# Patient Record
Sex: Male | Born: 1945 | Race: Black or African American | Hispanic: No | Marital: Married | Smoking: Never smoker
Health system: Southern US, Community
[De-identification: ages and names within clinical notes are randomized; demographics above are authoritative.]

## PROBLEM LIST (undated history)

## (undated) DIAGNOSIS — I10 Essential (primary) hypertension: Secondary | ICD-10-CM

---

## 2007-09-25 HISTORY — PX: COLONOSCOPY: SHX174

## 2011-06-30 ENCOUNTER — Encounter: Payer: Self-pay | Admitting: *Deleted

## 2011-06-30 ENCOUNTER — Emergency Department (HOSPITAL_BASED_OUTPATIENT_CLINIC_OR_DEPARTMENT_OTHER)
Admission: EM | Admit: 2011-06-30 | Discharge: 2011-06-30 | Disposition: A | Discharge status: Home / Self Care | Payer: Medicare Other | Attending: Emergency Medicine | Admitting: Emergency Medicine

## 2011-06-30 DIAGNOSIS — J209 Acute bronchitis, unspecified: Secondary | ICD-10-CM | POA: Insufficient documentation

## 2011-06-30 DIAGNOSIS — R05 Cough: Secondary | ICD-10-CM | POA: Insufficient documentation

## 2011-06-30 DIAGNOSIS — R079 Chest pain, unspecified: Secondary | ICD-10-CM | POA: Insufficient documentation

## 2011-06-30 DIAGNOSIS — J4489 Other specified chronic obstructive pulmonary disease: Secondary | ICD-10-CM | POA: Insufficient documentation

## 2011-06-30 DIAGNOSIS — R059 Cough, unspecified: Secondary | ICD-10-CM | POA: Insufficient documentation

## 2011-06-30 DIAGNOSIS — J449 Chronic obstructive pulmonary disease, unspecified: Secondary | ICD-10-CM | POA: Insufficient documentation

## 2011-06-30 DIAGNOSIS — J069 Acute upper respiratory infection, unspecified: Secondary | ICD-10-CM

## 2011-06-30 MED ORDER — HYDROCOD POLST-CHLORPHEN POLST 10-8 MG/5ML PO LQCR: 5.0000 mL | Freq: Two times a day (BID) | ORAL | Status: DC | PRN

## 2011-06-30 MED ORDER — AZITHROMYCIN 250 MG PO TABS: 250.0000 mg | ORAL_TABLET | Freq: Every day | ORAL | Status: AC

## 2011-06-30 NOTE — ED Notes (Signed)
Pt reports right side lower rib pain and cough x 1.5 weeks

## 2011-06-30 NOTE — ED Provider Notes (Signed)
History     CSN: [id number] Arrival date & time: 06/30/2011  9:52 PM  Chief Complaint  Patient presents with  . Cough  . Chest Pain    (Consider location/radiation/quality/duration/timing/severity/associated sxs/prior treatment) HPI Comments: The patient is a 65 year old male who presents for evaluation of cough that is nonproductive and has been going on for approximately one month but has been worse over the past 1-1/2 weeks with right-sided chest discomfort with coughing. He reports it is a dry cough, and he denies any fever or chills. He denies shortness of breath and denies posttussive emesis. He denies nasal congestion, headache, sore throat, or ear pain.  Patient is a 65 y.o. male presenting with cough and chest pain. The history is provided by the patient.  Cough This is a new problem. The current episode started more than 1 week ago. The problem occurs every few minutes. The problem has been gradually worsening. The cough is non-productive. There has been no fever. Associated symptoms include chest pain. Pertinent negatives include no chills, no sweats, no weight loss, no ear congestion, no ear pain, no headaches, no rhinorrhea, no sore throat, no myalgias, no shortness of breath, no wheezing and no eye redness. He has tried cough syrup for the symptoms. The treatment provided mild relief. His past medical history is significant for COPD, emphysema and asthma.  Chest Pain The chest pain began 1 - 2 weeks ago. Duration of episode(s) is 5 seconds. Chest pain occurs intermittently. The chest pain is resolved. The pain is associated with coughing. At its most intense, the pain is at 5/10. The pain is currently at 0/10. The severity of the pain is moderate. The quality of the pain is described as aching. The pain does not radiate. Exacerbated by: coughing. Primary symptoms include cough. Pertinent negatives for primary symptoms include no fever, no fatigue, no syncope, no shortness of breath,  no wheezing, no palpitations, no abdominal pain, no nausea, no vomiting and no dizziness.  Pertinent negatives for associated symptoms include no claudication, no diaphoresis, no lower extremity edema, no near-syncope, no numbness, no orthopnea, no paroxysmal nocturnal dyspnea and no weakness.     History reviewed. No pertinent past medical history.  History reviewed. No pertinent past surgical history.  History reviewed. No pertinent family history.  History  Substance Use Topics  . Smoking status: Never Smoker   . Smokeless tobacco: Never Used  . Alcohol Use: Yes     rare      Review of Systems  Constitutional: Negative for fever, chills, weight loss, diaphoresis and fatigue.  HENT: Negative for ear pain, sore throat and rhinorrhea.   Eyes: Negative for redness.  Respiratory: Positive for cough. Negative for shortness of breath and wheezing.   Cardiovascular: Positive for chest pain. Negative for palpitations, orthopnea, claudication, syncope and near-syncope.  Gastrointestinal: Negative for nausea, vomiting and abdominal pain.  Musculoskeletal: Negative for myalgias.  Skin: Negative.   Neurological: Negative for dizziness, weakness, numbness and headaches.  Hematological: Negative for adenopathy.  Psychiatric/Behavioral: Negative.     Allergies  Review of patient's allergies indicates no known allergies.  Home Medications   Current Outpatient Rx  Name Route Sig Dispense Refill  . SYSTANE OP Both Eyes Place 1 drop into both eyes daily as needed. For dry eyes       BP 164/79  Pulse 60  Temp(Src) 99.2 F (37.3 C) (Oral)  Resp 20  Ht 5\' 7"  (1.702 m)  Wt 160 lb (72.576 kg)  BMI 25.06  kg/m2  SpO2 100%  Physical Exam  Nursing note and vitals reviewed. Constitutional: He is oriented to person, place, and time. He appears well-developed and well-nourished. No distress.  HENT:  Head: Normocephalic and atraumatic.  Mouth/Throat: Oropharynx is clear and moist.    Eyes: Conjunctivae and EOM are normal.  Neck: Normal range of motion.  Cardiovascular: Normal rate, regular rhythm, normal heart sounds and intact distal pulses.  Exam reveals no gallop and no friction rub.   No murmur heard. Pulmonary/Chest: Effort normal and breath sounds normal. No respiratory distress. He has no wheezes. He has no rales. He exhibits no tenderness.  Abdominal: Soft. Bowel sounds are normal. He exhibits no distension. There is no tenderness. There is no rebound and no guarding.  Musculoskeletal: Normal range of motion. He exhibits no edema and no tenderness.  Neurological: He is alert and oriented to person, place, and time. He has normal reflexes. No cranial nerve deficit. He exhibits normal muscle tone. Coordination normal.  Skin: Skin is warm and dry. No rash noted. He is not diaphoretic. No erythema. No pallor.  Psychiatric: He has a normal mood and affect.    ED Course  Procedures (including critical care time)  Labs Reviewed - No data to display No results found.   No diagnosis found.    MDM  Viral URI, bronchitis, pneumonia not suspected on physical examination, and this appears to be pleuritic/bronchitic chest pain, not cardiac chest pain.        Felisa Bonier, MD 07/02/11 1034

## 2011-08-14 ENCOUNTER — Encounter (HOSPITAL_COMMUNITY): Payer: Self-pay | Admitting: Emergency Medicine

## 2011-08-14 ENCOUNTER — Emergency Department (HOSPITAL_COMMUNITY)
Admission: EM | Admit: 2011-08-14 | Discharge: 2011-08-15 | Disposition: A | Discharge status: Home / Self Care | Payer: Medicare Other | Attending: Emergency Medicine | Admitting: Emergency Medicine

## 2011-08-14 ENCOUNTER — Emergency Department (HOSPITAL_COMMUNITY): Payer: Medicare Other

## 2011-08-14 DIAGNOSIS — J069 Acute upper respiratory infection, unspecified: Secondary | ICD-10-CM

## 2011-08-14 DIAGNOSIS — R059 Cough, unspecified: Secondary | ICD-10-CM | POA: Insufficient documentation

## 2011-08-14 DIAGNOSIS — Z79899 Other long term (current) drug therapy: Secondary | ICD-10-CM | POA: Insufficient documentation

## 2011-08-14 DIAGNOSIS — R05 Cough: Secondary | ICD-10-CM | POA: Insufficient documentation

## 2011-08-14 DIAGNOSIS — R51 Headache: Secondary | ICD-10-CM | POA: Insufficient documentation

## 2011-08-14 DIAGNOSIS — IMO0001 Reserved for inherently not codable concepts without codable children: Secondary | ICD-10-CM | POA: Insufficient documentation

## 2011-08-14 LAB — COMPREHENSIVE METABOLIC PANEL
Alkaline Phosphatase: 56 U/L (ref 39–117)
BUN: 9 mg/dL (ref 6–23)
CO2: 30 mEq/L (ref 19–32)
Chloride: 103 mEq/L (ref 96–112)
GFR calc Af Amer: >90 mL/min (ref >90)
GFR calc non Af Amer: 86 mL/min — ABNORMAL LOW (ref >90)
Glucose, Bld: 93 mg/dL (ref 70–99)
Potassium: 4.4 mEq/L (ref 3.5–5.1)
Total Bilirubin: 0.7 mg/dL (ref 0.3–1.2)
Total Protein: 7.5 g/dL (ref 6.0–8.3)

## 2011-08-14 LAB — CBC
HCT: 42.2 % (ref 39.0–52.0)
Hemoglobin: 14.4 g/dL (ref 13.0–17.0)
MCHC: 34.1 g/dL (ref 30.0–36.0)
RBC: 4.72 MIL/uL (ref 4.22–5.81)

## 2011-08-14 LAB — URINALYSIS, ROUTINE W REFLEX MICROSCOPIC
Glucose, UA: NEGATIVE mg/dL
Ketones, ur: NEGATIVE mg/dL
Leukocytes, UA: NEGATIVE
pH: 6.5 (ref 5.0–8.0)

## 2011-08-14 LAB — URINE MICROSCOPIC-ADD ON

## 2011-08-14 MED ORDER — BENZONATATE 100 MG PO CAPS: 100.0000 mg | ORAL_CAPSULE | Freq: Three times a day (TID) | ORAL | Status: AC

## 2011-08-14 MED ORDER — CETIRIZINE-PSEUDOEPHEDRINE ER 5-120 MG PO TB12: 1.0000 | ORAL_TABLET | Freq: Every day | ORAL | Status: DC

## 2011-08-14 MED ORDER — OXYCODONE-ACETAMINOPHEN 5-325 MG PO TABS
1.0000 | ORAL_TABLET | Freq: Once | ORAL | Status: AC
  Administered 2011-08-15: 1 via ORAL
  Filled 2011-08-14: qty 1

## 2011-08-14 NOTE — ED Notes (Signed)
PT. REPORTS HEADACHE AND GENERALIZED BODY ACHES Lucas Matthews ONSET THIS EVENING , DENIES FEVER OR CHILLS, SLIGHT LIGHTHEADED.

## 2011-08-16 NOTE — ED Provider Notes (Signed)
History     CSN: [id number] Arrival date & time: 08/14/2011  8:41 PM   First MD Initiated Contact with Patient 08/14/11 2203      Chief Complaint  Patient presents with  . Headache    (Consider location/radiation/quality/duration/timing/severity/associated sxs/prior treatment) Patient is a 65 y.o. male presenting with headaches. The history is provided by the patient.  Headache     Pt presents to the ED with complaints of generalized body aches and cough. Pt denies fever, chills. Pt denies chest pain, abdominal pain, urinary symptoms, severe irretractable headache or focal weakness, difficulty ambulating and respiratory distress.  History reviewed. No pertinent past medical history.  History reviewed. No pertinent past surgical history.  No family history on file.  History  Substance Use Topics  . Smoking status: Never Smoker   . Smokeless tobacco: Never Used  . Alcohol Use: Yes     rare      Review of Systems  Neurological: Positive for headaches.    Allergies  Review of patient's allergies indicates no known allergies.  Home Medications   Current Outpatient Rx  Name Route Sig Dispense Refill  . SYSTANE OP Both Eyes Place 1 drop into both eyes daily as needed. For dry eyes     . BENZONATATE 100 MG PO CAPS Oral Take 1 capsule (100 mg total) by mouth every 8 (eight) hours. 21 capsule 0  . CETIRIZINE-PSEUDOEPHEDRINE 5-120 MG PO TB12 Oral Take 1 tablet by mouth daily. 30 tablet 0    BP 179/86  Pulse 71  Temp 99.3 F (37.4 C)  Resp 20  SpO2 100%  Physical Exam  Constitutional: He is oriented to person, place, and time. He appears well-developed and well-nourished.  HENT:  Head: Normocephalic and atraumatic.  Eyes: EOM are normal. Pupils are equal, round, and reactive to light.  Neck: Normal range of motion.  Cardiovascular: Normal rate and regular rhythm.   Pulmonary/Chest: Effort normal and breath sounds normal.  Musculoskeletal: Normal range of  motion.  Neurological: He is alert and oriented to person, place, and time.  Skin: Skin is warm and dry.    ED Course  Procedures (including critical care time)  Labs Reviewed  URINALYSIS, ROUTINE W REFLEX MICROSCOPIC - Abnormal; Notable for the following:    Hgb urine dipstick TRACE (*)    All other components within normal limits  COMPREHENSIVE METABOLIC PANEL - Abnormal; Notable for the following:    GFR calc non Af Amer 86 (*)    All other components within normal limits  CBC  URINE MICROSCOPIC-ADD ON  LAB REPORT - SCANNED   Dg Chest 2 View  08/14/2011  *RADIOLOGY REPORT*  Clinical Data: Progressively worsening cough over the past 3 months.  CHEST - 2 VIEW 08/14/2011:  Comparison: None.  Findings: Cardiac silhouette normal in size, accentuated on the PA image by the mild pectus excavatum sternal deformity.  Hilar and mediastinal contours unremarkable.  Lungs clear.  Bronchovascular markings normal.  No pleural effusions.  Mild degenerative changes involving the thoracic spine.  IMPRESSION: No acute cardiopulmonary disease.  Original Report Authenticated By: Arnell Sieving, M.D.     1. Upper respiratory infection       MDM  I made my attending aware that pt in fastrack over 30 years old. Pt stable and work-up is negative.        Dorthula Matas, PA 08/16/11 [phone]

## 2011-08-25 NOTE — ED Provider Notes (Signed)
Medical screening examination/treatment/procedure(s) were performed by non-physician practitioner and as supervising physician I was immediately available for consultation/collaboration.    Nelia Shi, MD 08/25/11 2025

## 2012-05-05 ENCOUNTER — Ambulatory Visit: Payer: Federal, State, Local not specified - PPO | Admitting: Family Medicine

## 2012-05-19 ENCOUNTER — Ambulatory Visit: Payer: Federal, State, Local not specified - PPO | Admitting: Family Medicine

## 2012-05-21 ENCOUNTER — Encounter: Payer: Self-pay | Admitting: Family Medicine

## 2012-05-21 ENCOUNTER — Ambulatory Visit (INDEPENDENT_AMBULATORY_CARE_PROVIDER_SITE_OTHER): Payer: Medicare Other | Admitting: Family Medicine

## 2012-05-21 VITALS — BP 169/94 | HR 57 | Ht 66.93 in | Wt 154.0 lb

## 2012-05-21 DIAGNOSIS — Z7689 Persons encountering health services in other specified circumstances: Secondary | ICD-10-CM

## 2012-05-21 DIAGNOSIS — Z7189 Other specified counseling: Secondary | ICD-10-CM

## 2012-05-21 DIAGNOSIS — I10 Essential (primary) hypertension: Secondary | ICD-10-CM

## 2012-05-21 NOTE — Progress Notes (Signed)
  Subjective:    Patient ID: Lucas Matthews, male    DOB: [birth year], 66 y.o.   MRN: [id number]  HPI  New patient here to establish care.  Moved to [place] from [place], [place].  He denies any previous medical problems, although blood pressure is elevated today.  He does not take any medications.  He plans to make a follow up appointment for a complete physical and lab work.  He did have a colonoscopy ~4 years ago that was normal in [place], [place] by a Dr. Yetta Barre.   Review of Systems Denies headache, chest pain, shortness of breath, changes in bowels, difficulty with urination, muscle or joint pain.     Objective:   Physical Exam  Constitutional: He appears well-nourished. No distress.  HENT:  Head: Normocephalic and atraumatic.  Cardiovascular: Normal rate and regular rhythm.   Pulmonary/Chest: Effort normal and breath sounds normal.  Abdominal: Soft. Bowel sounds are normal. He exhibits no distension. There is no tenderness.  Musculoskeletal: He exhibits no edema.          Assessment & Plan:

## 2012-05-21 NOTE — Patient Instructions (Addendum)
Thank you for coming in today, it was very nice to meet you. Your blood pressure is elevated today, if it continues to be elevated at your next visit we should discuss starting a medication to help lower this. Please make physical at your convenience.  We will check lab work and perform a complete physical exam at that time.   You may certainly return sooner for any acute issues. Have a great day!

## 2012-07-16 ENCOUNTER — Encounter: Payer: Federal, State, Local not specified - PPO | Admitting: Family Medicine

## 2014-03-19 ENCOUNTER — Emergency Department (INDEPENDENT_AMBULATORY_CARE_PROVIDER_SITE_OTHER)
Admission: EM | Admit: 2014-03-19 | Discharge: 2014-03-19 | Disposition: A | Discharge status: Home / Self Care | Payer: Medicare Other | Source: Home / Self Care

## 2014-03-19 ENCOUNTER — Encounter (HOSPITAL_COMMUNITY): Payer: Self-pay | Admitting: Emergency Medicine

## 2014-03-19 DIAGNOSIS — X58XXXA Exposure to other specified factors, initial encounter: Secondary | ICD-10-CM

## 2014-03-19 DIAGNOSIS — S46819A Strain of other muscles, fascia and tendons at shoulder and upper arm level, unspecified arm, initial encounter: Secondary | ICD-10-CM

## 2014-03-19 DIAGNOSIS — S43499A Other sprain of unspecified shoulder joint, initial encounter: Secondary | ICD-10-CM

## 2014-03-19 DIAGNOSIS — S46811A Strain of other muscles, fascia and tendons at shoulder and upper arm level, right arm, initial encounter: Secondary | ICD-10-CM

## 2014-03-19 DIAGNOSIS — M609 Myositis, unspecified: Secondary | ICD-10-CM

## 2014-03-19 DIAGNOSIS — IMO0001 Reserved for inherently not codable concepts without codable children: Secondary | ICD-10-CM

## 2014-03-19 HISTORY — DX: Essential (primary) hypertension: I10

## 2014-03-19 MED ORDER — TRAMADOL HCL 50 MG PO TABS: 50.0000 mg | ORAL_TABLET | Freq: Four times a day (QID) | ORAL | Status: DC | PRN

## 2014-03-19 MED ORDER — NAPROXEN 375 MG PO TABS: 375.0000 mg | ORAL_TABLET | Freq: Two times a day (BID) | ORAL | Status: DC

## 2014-03-19 MED ORDER — DICLOFENAC SODIUM 1 % TD GEL
1.0000 | Freq: Four times a day (QID) | TRANSDERMAL | Status: AC
Start: 2014-03-19 — End: ?

## 2014-03-19 NOTE — ED Notes (Signed)
C/o 2 week duration of pain in neck and shoulder. Using OTC w minimal relief, NAD. [facility], but interested in local care for off schedule issues

## 2014-03-19 NOTE — ED Provider Notes (Signed)
CSN: [id number]     Arrival date & time 03/19/14  1226 History   First MD Initiated Contact with Patient 03/19/14 1246     Chief Complaint  Patient presents with  . Neck Pain   (Consider location/radiation/quality/duration/timing/severity/associated sxs/prior Treatment) HPI Comments: C/O pain R side of neck and top of shoulder for 2 weeks, comes and goes. No known trauma. 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 Overhead work increases pain.    Past Medical History  Diagnosis Date  . Hypertension    Past Surgical History  Procedure Laterality Date  . [place]  2009    [place]   History reviewed. No pertinent family history. History  Substance Use Topics  . Smoking status: Never Smoker   . Smokeless tobacco: Never Used  . Alcohol Use: Yes     Comment: rare    Review of Systems  Constitutional: Positive for activity change. Negative for fever and fatigue.  Respiratory: Negative.   Gastrointestinal: Negative.   Genitourinary: Negative.   Musculoskeletal: Positive for myalgias. Negative for back pain, gait problem, joint swelling and neck stiffness.       As per HPI  Skin: Negative.   Neurological: Negative for dizziness, weakness, numbness and headaches.    Allergies  Review of patient's allergies indicates no known allergies.  Home Medications   Prior to Admission medications   Medication Sig Start Date End Date Taking? Authorizing Provider  alfuzosin (UROXATRAL) 10 MG 24 hr tablet Take 10 mg by mouth daily with breakfast.   Yes Historical Provider, MD  hydrochlorothiazide (HYDRODIURIL) 25 MG tablet Take 25 mg by mouth daily.   Yes Historical Provider, MD  diclofenac sodium (VOLTAREN) 1 % GEL Apply 1 application topically 4 (four) times daily. 03/19/14   Hayden Rasmussenavid Mabe, NP  naproxen (NAPROSYN) 375 MG tablet Take 1 tablet (375 mg total) by mouth 2 (two) times daily. Prn muscle pain. Take with food. 03/19/14   Hayden Rasmussenavid Mabe, NP  traMADol (ULTRAM) 50 MG tablet Take 1 tablet (50 mg  total) by mouth every 6 (six) hours as needed. 03/19/14   Hayden Rasmussenavid Mabe, NP   BP 136/55  Pulse 64  Temp(Src) 98.2 F (36.8 C) (Oral)  Resp 20  SpO2 98% Physical Exam  Nursing note and vitals reviewed. Constitutional: He is oriented to person, place, and time. He appears well-developed and well-nourished.  HENT:  Head: Normocephalic and atraumatic.  Eyes: EOM are normal.  Neck: Normal range of motion. Neck supple.  Pulmonary/Chest: Effort normal. No respiratory distress.  Musculoskeletal:  Mild tenderness in R trapezius including the R lateral neck points of insertion and the ridge.  Mild tenderness R deltoid muscle. No swelling, deformity or discoloration. No shoulder asymmetry. Full ROM of shoulder joint. Pain at 160 deg to the trapezius ridge.Distal N/V, M/S intact. Rad pulse 2+  Neurological: He is alert and oriented to person, place, and time. No cranial nerve deficit.  Skin: Skin is warm and dry.  Psychiatric: He has a normal mood and affect.    ED Course  Procedures (including critical care time) Labs Review Labs Reviewed - No data to display  Imaging Review No results found.   MDM   1. Trapezius strain, right, initial encounter   2. Myofasciitis     Modify activties that exacerbated pain as discussed Naprosyn bid prn Diclofenac gel as dir Tramadol 50 mg #20 Heat several times a day    Hayden Rasmussenavid Mabe, NP 03/19/14 1332

## 2014-03-19 NOTE — Discharge Instructions (Signed)
Muscle Strain A muscle strain is an injury that occurs when a muscle is stretched beyond its normal length. Usually a small number of muscle fibers are torn when this happens. Muscle strain is rated in degrees. First-degree strains have the least amount of muscle fiber tearing and pain. Second-degree and third-degree strains have increasingly more tearing and pain.  Usually, recovery from muscle strain takes 1-2 weeks. Complete healing takes 5-6 weeks.  CAUSES  Muscle strain happens when a sudden, violent force placed on a muscle stretches it too far. This may occur with lifting, sports, or a fall.  RISK FACTORS Muscle strain is especially common in athletes.  SIGNS AND SYMPTOMS At the site of the muscle strain, there may be:  Pain.  Bruising.  Swelling.  Difficulty using the muscle due to pain or lack of normal function. DIAGNOSIS  Your health care provider will perform a physical exam and ask about your medical history. TREATMENT  Often, the best treatment for a muscle strain is resting, icing, and applying cold compresses to the injured area.  HOME CARE INSTRUCTIONS   Use the PRICE method of treatment to promote muscle healing during the first 2-3 days after your injury. The PRICE method involves:  Protecting the muscle from being injured again.  Restricting your activity and resting the injured body part.  Icing your injury. To do this, put ice in a plastic bag. Place a towel between your skin and the bag. Then, apply the ice and leave it on from 15-20 minutes each hour. After the third day, switch to moist heat packs.  Apply compression to the injured area with a splint or elastic bandage. Be careful not to wrap it too tightly. This may interfere with blood circulation or increase swelling.  Elevate the injured body part above the level of your heart as often as you can.  Only take over-the-counter or prescription medicines for pain, discomfort, or fever as directed by your  health care provider.  Warming up prior to exercise helps to prevent future muscle strains. SEEK MEDICAL CARE IF:   You have increasing pain or swelling in the injured area.  You have numbness, tingling, or a significant loss of strength in the injured area. MAKE SURE YOU:   Understand these instructions.  Will watch your condition.  Will get help right away if you are not doing well or get worse. Document Released: 09/10/2005 Document Revised: 07/01/2013 Document Reviewed: 04/09/2013 [facility] Patient Information 2015 [employer] [place]. This information is not intended to replace advice given to you by your health care provider. Make sure you discuss any questions you have with your health care provider.  Myofascial Pain Syndrome  Myofascial pain (MP) is one of the most common causes of discomfort. This pain may be felt in the muscles. It may come and go. MP always has trigger or tender points in the muscle that will cause pain when pressed. HOME CARE   Learn to do gentle exercise. Stretch.  Change your position every hour.  Eat a healthy diet.  Find ways to relax and lessen stress.  Avoid things that make your pain worse.  Get a massage by someone trained to release trigger or tender points.  Only take medicine as told by your doctor.  Get follow-up care as needed. GET HELP RIGHT AWAY IF:   Your pain suddenly gets worse.  You are not able to move your arms or legs.  You have pain in your chest.  It is hard to  breathe. MAKE SURE YOU:  Understand these instructions.  Will watch your condition.  Will get help right away if you are not doing well or get worse. Document Released: 08/23/2008 Document Revised: 12/03/2011 Document Reviewed: 08/23/2008 [facility] Patient Information 2015 [place] [place]. This information is not intended to replace advice given to you by your health care provider. Make sure you discuss any questions you have with your health care  provider.

## 2014-03-23 NOTE — ED Provider Notes (Signed)
Medical screening examination/treatment/procedure(s) were performed by a resident physician or non-physician practitioner and as the supervising physician I was immediately available for consultation/collaboration.  Evan Corey, MD    Evan S Corey, MD 03/23/14 0732 

## 2017-06-26 ENCOUNTER — Emergency Department (HOSPITAL_BASED_OUTPATIENT_CLINIC_OR_DEPARTMENT_OTHER)
Admission: EM | Admit: 2017-06-26 | Discharge: 2017-06-26 | Disposition: A | Discharge status: Home / Self Care | Payer: Medicare Other | Attending: Emergency Medicine | Admitting: Emergency Medicine

## 2017-06-26 ENCOUNTER — Encounter (HOSPITAL_BASED_OUTPATIENT_CLINIC_OR_DEPARTMENT_OTHER): Payer: Self-pay

## 2017-06-26 DIAGNOSIS — I10 Essential (primary) hypertension: Secondary | ICD-10-CM | POA: Diagnosis not present

## 2017-06-26 DIAGNOSIS — R05 Cough: Secondary | ICD-10-CM

## 2017-06-26 DIAGNOSIS — R51 Headache: Secondary | ICD-10-CM | POA: Diagnosis not present

## 2017-06-26 DIAGNOSIS — Z79899 Other long term (current) drug therapy: Secondary | ICD-10-CM | POA: Diagnosis not present

## 2017-06-26 DIAGNOSIS — R0981 Nasal congestion: Secondary | ICD-10-CM | POA: Diagnosis present

## 2017-06-26 DIAGNOSIS — R059 Cough, unspecified: Secondary | ICD-10-CM

## 2017-06-26 MED ORDER — CETIRIZINE-PSEUDOEPHEDRINE ER 5-120 MG PO TB12: 1.0000 | ORAL_TABLET | Freq: Two times a day (BID) | ORAL | Status: DC | PRN

## 2017-06-26 MED ORDER — OXYMETAZOLINE HCL 0.05 % NA SOLN
1.0000 | Freq: Two times a day (BID) | NASAL | Status: DC | PRN
  Administered 2017-06-26: 1 via NASAL
  Filled 2017-06-26: qty 15

## 2017-06-26 NOTE — ED Provider Notes (Signed)
[facility] Provider Note: Lowella Dell, MD, FACEP  CSN: [id number] MRN: [id number] ARRIVAL: 06/26/17 at 0444 ROOM: MH02/MH02   CHIEF COMPLAINT  URI   HISTORY OF PRESENT ILLNESS  06/26/17 4:55 AM Lucas Matthews is a 71 y.o. male who is currently being worked up for possible asbestos exposure in the past. He is on a Breo Ellipta inhaler for his chronic lung symptoms. He is here with a three-day history of nasal congestion, headache, scratchy throat and "nagging" dry cough. He denies fever or significant shortness of breath. He did have diarrhea yesterday. He has been taking over-the-counter Delsym for the cough but no decongestants or analgesics. He rates his headache as a 5 out of 10.   Past Medical History:  Diagnosis Date  . Hypertension     Past Surgical History:  Procedure Laterality Date  . COLONOSCOPY  2009   Done in [place], [place]    No family history on file.  Social History  Substance Use Topics  . Smoking status: Never Smoker  . Smokeless tobacco: Never Used  . Alcohol use Yes     Comment: rare    Prior to Admission medications   Medication Sig Start Date End Date Taking? Authorizing Provider  alfuzosin (UROXATRAL) 10 MG 24 hr tablet Take 10 mg by mouth daily with breakfast.    [provider]  diclofenac sodium (VOLTAREN) 1 % GEL Apply 1 application topically 4 (four) times daily. 03/19/14   Hayden Rasmussen, NP  hydrochlorothiazide (HYDRODIURIL) 25 MG tablet Take 25 mg by mouth daily.    [provider]  naproxen (NAPROSYN) 375 MG tablet Take 1 tablet (375 mg total) by mouth 2 (two) times daily. Prn muscle pain. Take with food. 03/19/14   Hayden Rasmussen, NP  traMADol (ULTRAM) 50 MG tablet Take 1 tablet (50 mg total) by mouth every 6 (six) hours as needed. 03/19/14   Hayden Rasmussen, NP    Allergies Patient has no known allergies.   REVIEW OF SYSTEMS  Negative except as noted here or in the History of Present Illness.   PHYSICAL  EXAMINATION  Initial Vital Signs Blood pressure (!) 146/75, pulse 84, temperature 98.4 F (36.9 C), temperature source Oral, resp. rate 18, height  (1.702 m), weight 71.7 kg (158 lb), SpO2 98 %.  Examination General: Well-developed, well-nourished male in no acute distress; appearance consistent with age of record HENT: normocephalic; atraumatic; no pharyngeal erythema or exudate; mild nasal congestion; sinuses not tender to percussion Eyes: pupils equal, round and reactive to light; extraocular muscles intact Neck: supple Heart: regular rate and rhythm Lungs: Decreased air movement bilaterally without wheezing Abdomen: soft; nondistended; nontender; bowel sounds present Extremities: No deformity; full range of motion Neurologic: Awake, alert and oriented; motor function intact in all extremities and symmetric; no facial droop Skin: Warm and dry Psychiatric: Normal mood and affect   RESULTS  Summary of this visit's results, reviewed by myself:   EKG Interpretation  Date/Time:    Ventricular Rate:    PR Interval:    QRS Duration:   QT Interval:    QTC Calculation:   R Axis:     Text Interpretation:        Laboratory Studies: No results found for this or any previous visit (from the past 24 hour(s)). Imaging Studies: No results found.  ED COURSE  Nursing notes and initial vitals signs, including pulse oximetry, reviewed.  Vitals:   06/26/17 0451  BP: (!) 146/75  Pulse: 84  Resp: 18  Temp: 98.4 F (36.9 C)  TempSrc: Oral  SpO2: 98%  Weight: 71.7 kg (158 lb)  Height:  (1.702 m)   Will treat with Afrin for three days and start Zyrtec-D as his symptoms are suggestive of allergic etiology.  PROCEDURES    ED DIAGNOSES     ICD-10-CM   1. Nasal congestion R09.81   2. Cough R05        Aviance [place], Jonny Ruiz, MD 06/26/17 [phone]

## 2017-06-26 NOTE — ED Triage Notes (Signed)
Pt c/o runny nose, headache and scratchy throat for three days, no fevers, pt tried delsym a few times without relief

## 2017-06-26 NOTE — ED Notes (Signed)
Pt verbalizes understanding of d/c instructions and denies any further needs at this time. 

## 2018-07-24 ENCOUNTER — Emergency Department: Payer: Medicare Other

## 2018-07-24 ENCOUNTER — Other Ambulatory Visit: Payer: Self-pay

## 2018-07-24 DIAGNOSIS — J069 Acute upper respiratory infection, unspecified: Secondary | ICD-10-CM | POA: Insufficient documentation

## 2018-07-24 DIAGNOSIS — Z79899 Other long term (current) drug therapy: Secondary | ICD-10-CM | POA: Insufficient documentation

## 2018-07-24 DIAGNOSIS — I10 Essential (primary) hypertension: Secondary | ICD-10-CM | POA: Insufficient documentation

## 2018-07-24 DIAGNOSIS — R0981 Nasal congestion: Secondary | ICD-10-CM | POA: Diagnosis present

## 2018-07-24 LAB — BASIC METABOLIC PANEL
Anion gap: 6 (ref 5–15)
BUN: 21 mg/dL (ref 8–23)
CALCIUM: 8.5 mg/dL — AB (ref 8.9–10.3)
CO2: 29 mmol/L (ref 22–32)
CREATININE: 1.14 mg/dL (ref 0.61–1.24)
Chloride: 105 mmol/L (ref 98–111)
GFR calc non Af Amer: >60 mL/min (ref >60)
Glucose, Bld: 85 mg/dL (ref 70–99)
Potassium: 3.8 mmol/L (ref 3.5–5.1)
Sodium: 140 mmol/L (ref 135–145)

## 2018-07-24 LAB — CBC
HCT: 43 % (ref 39.0–52.0)
Hemoglobin: 14.2 g/dL (ref 13.0–17.0)
MCH: 30 pg (ref 26.0–34.0)
MCHC: 33 g/dL (ref 30.0–36.0)
MCV: 90.7 fL (ref 80.0–100.0)
NRBC: 0 % (ref 0.0–0.2)
PLATELETS: 172 10*3/uL (ref 150–400)
RBC: 4.74 MIL/uL (ref 4.22–5.81)
RDW: 13.2 % (ref 11.5–15.5)
WBC: 5.5 10*3/uL (ref 4.0–10.5)

## 2018-07-24 LAB — TROPONIN I: Troponin I: <0.03 ng/mL (ref <0.03)

## 2018-07-24 NOTE — ED Triage Notes (Signed)
Pt c/o headache. HA yesterday.    Dx with URI a few days ago. Also c/o of SOB. States "I'm breathing kind of short like." no resp distress noted. Denies fever.   A&O, ambulatory. No distress noted.

## 2018-07-25 ENCOUNTER — Emergency Department: Payer: Medicare Other

## 2018-07-25 ENCOUNTER — Emergency Department
Admission: EM | Admit: 2018-07-25 | Discharge: 2018-07-25 | Disposition: A | Discharge status: Home / Self Care | Payer: Medicare Other | Attending: Emergency Medicine | Admitting: Emergency Medicine

## 2018-07-25 DIAGNOSIS — J069 Acute upper respiratory infection, unspecified: Secondary | ICD-10-CM

## 2018-07-25 MED ORDER — AZITHROMYCIN 500 MG PO TABS: 500.0000 mg | ORAL_TABLET | Freq: Every day | ORAL | 0 refills | Status: AC

## 2018-07-25 MED ORDER — AZITHROMYCIN 500 MG PO TABS: 500.0000 mg | ORAL_TABLET | Freq: Every day | ORAL | 0 refills | Status: DC

## 2018-07-25 NOTE — ED Provider Notes (Signed)
[facility] Emergency Department Provider Note    First MD Initiated Contact with Patient 07/25/18 [id number]     (approximate)  I have reviewed the triage vital signs and the nursing notes.   HISTORY  Chief Complaint Headache and Shortness of Breath    HPI Lucas Matthews is a 72 y.o. 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  Patient denies any fever.  Patient states that he was seen by urgent care and diagnosed with URI type Tessalon Perles and nasal spray.  Patient states that he was supposed to return to work tonight however states that he does not feel "up to it secondary to symptoms.  Patient admitted to headache on arrival to the emergency department however he states that headache is since resolved.  Patient denies any weakness numbness gait instability or visual changes.  Past Medical History:  Diagnosis Date  . Hypertension     There are no active problems to display for this patient.   Past Surgical History:  Procedure Laterality Date  . COLONOSCOPY  2009   Done in [place], [place]    Prior to Admission medications   Medication Sig Start Date End Date Taking? Authorizing Provider  alfuzosin (UROXATRAL) 10 MG 24 hr tablet Take 10 mg by mouth daily with breakfast.    [provider]  cetirizine-pseudoephedrine (ZYRTEC-D) 5-120 MG tablet Take 1 tablet by mouth 2 (two) times daily as needed for allergies or rhinitis. 06/26/17   Molpus, John, MD  diclofenac sodium (VOLTAREN) 1 % GEL Apply 1 application topically 4 (four) times daily. 03/19/14   Hayden Rasmussen, NP  hydrochlorothiazide (HYDRODIURIL) 25 MG tablet Take 25 mg by mouth daily.    [provider]  naproxen (NAPROSYN) 375 MG tablet Take 1 tablet (375 mg total) by mouth 2 (two) times daily. Prn muscle pain.  Take with food. 03/19/14   Hayden Rasmussen, NP  traMADol (ULTRAM) 50 MG tablet Take 1 tablet (50 mg total) by mouth every 6 (six) hours as needed. 03/19/14   Hayden Rasmussen, NP    Allergies No known drug allergies History reviewed. No pertinent family history.  Social History Social History   Tobacco Use  . Smoking status: Never Smoker  . Smokeless tobacco: Never Used  Substance Use Topics  . Alcohol use: Yes    Comment: rare  . Drug use: No    Review of Systems Constitutional: No fever/chills Eyes: No visual changes. ENT: No sore throat.  Positive for nasal congestion Cardiovascular: Denies chest pain. Respiratory: Positive for dyspnea.   Gastrointestinal: No abdominal pain.  No nausea, no vomiting.  No diarrhea.  No constipation. Genitourinary: Negative for dysuria. Musculoskeletal: Negative for neck pain.  Negative for back pain. Integumentary: Negative for rash. Neurological: Negative for headaches, focal weakness or numbness.  ____________________________________________   PHYSICAL EXAM:  VITAL SIGNS: ED Triage Vitals  Enc Vitals Group     BP 07/24/18 2143 133/73     Pulse Rate 07/24/18 2143 (!) 59     Resp 07/24/18 2143 18     Temp 07/24/18 2143 98.4 F (36.9 C)     Temp Source 07/24/18 2143 Oral     SpO2 07/24/18 2143 99 %     Weight 07/24/18 2144 71.2 kg (157 lb)     Height 07/24/18 2144 1.702 m (5\' 7" )     Head  Circumference --      Peak Flow --      Pain Score 07/24/18 2144 6     Pain Loc --      Pain Edu? --      Excl. in GC? --     Constitutional: Alert and oriented. Well appearing and in no acute distress. Eyes: Conjunctivae are normal. PERRL. EOMI. Ears:  Healthy appearing ear canals and clear fluid noted posterior bilateral TM Nose: No congestion/rhinnorhea. Mouth/Throat: Mucous membranes are moist. Oropharynx non-erythematous. Neck: No stridor.   Cardiovascular: Normal rate, regular rhythm. Good peripheral circulation. Grossly normal heart  sounds. Respiratory: Normal respiratory effort.  No retractions. Lungs CTAB. Gastrointestinal: Soft and nontender. No distention.  Musculoskeletal: No lower extremity tenderness nor edema. No gross deformities of extremities. Neurologic:  Normal speech and language. No gross focal neurologic deficits are appreciated.  Skin:  Skin is warm, dry and intact. No rash noted. Psychiatric: Mood and affect are normal. Speech and behavior are normal.  ____________________________________________   LABS (all labs ordered are listed, but only abnormal results are displayed)  Labs Reviewed  BASIC METABOLIC PANEL - Abnormal; Notable for the following components:      Result Value   Calcium 8.5 (*)    All other components within normal limits  CBC  TROPONIN I   ____________________________________________  EKG  ED ECG REPORT I, [place] Freya Zobrist, the attending physician, personally viewed and interpreted this ECG.   Date: 07/25/2018  EKG Time: 9:42 PM  Rate: 57  Rhythm: Normal sinus rhythm  Axis: Normal  Intervals: Normal  ST&T Change: None  ____________________________________________  RADIOLOGY I, [place] Kester Stimpson, personally viewed and evaluated these images (plain radiographs) as part of my medical decision making, as well as reviewing the written report by the radiologist.  ED MD interpretation: Hazy masslike density over the left lateral mid upper lung possibly secondary to a pleural-based lesion.  Additional small nodular lesion noted in the right lung on chest x-ray per radiologist.  Official radiology report(s): Dg Chest 2 View  Result Date: 07/24/2018 CLINICAL DATA:  Shortness of breath and headache since yesterday. EXAM: CHEST - 2 VIEW COMPARISON:  None. FINDINGS: Lungs are adequately inflated without effusion or pneumothorax. Hazy masslike opacity over the lateral left mid to upper lung which may be pleural based. Much smaller possible pleural based density of the lower  lateral left lung. 1 cm nodular density over the right mid to lower lung. Possible minimal diaphragmatic calcification on the lateral film. Cardiomediastinal silhouette is within normal. There are mild degenerate changes of the spine. IMPRESSION: Hazy masslike density over the lateral left mid to upper lung which may be pleural based. Possible second pleural-based area of nodularity over the lower lateral left thorax. Small nodular density over the right midlung. Possible pleural calcification over the diaphragm. Recommend noncontrast chest CT for further evaluation. Electronically Signed   By: Elberta Fortis M.D.   On: 07/24/2018 22:17      Procedures   ____________________________________________   INITIAL IMPRESSION / ASSESSMENT AND PLAN / ED COURSE  As part of my medical decision making, I reviewed the following data within the electronic MEDICAL RECORD NUMBER   72 year old male presenting with above-stated history and physical exam secondary to nasal congestion nonproductive cough and dyspnea.  Chest x-ray revealed pleural-based possible lesion and additional pulmonary nodular lesion in the right.  Reviewed the patient's chart from [facility] which revealed that patient has been seen by pulmonary on multiple occasions secondary to  asbestos exposure and known pulmonary nodule.  CT scan from 12/19/2017 revealed stated nodule on x-ray as well as pleural-based lesion.  Suspect patient's symptoms today to be continuation of the patient's known URI.  Given 1-1/2-week history of same will prescribe azithromycin at this time. ____________________________________________  FINAL CLINICAL IMPRESSION(S) / ED DIAGNOSES  Final diagnoses:  Upper respiratory tract infection, unspecified type     MEDICATIONS GIVEN DURING THIS VISIT:  Medications - No data to display   ED Discharge Orders    None       Note:  This document was prepared using Dragon voice recognition software and may include unintentional  dictation errors.    Darci Current, MD 07/25/18 0111

## 2018-09-23 ENCOUNTER — Other Ambulatory Visit: Payer: Self-pay

## 2018-09-23 DIAGNOSIS — K7689 Other specified diseases of liver: Secondary | ICD-10-CM | POA: Insufficient documentation

## 2018-09-23 DIAGNOSIS — I10 Essential (primary) hypertension: Secondary | ICD-10-CM | POA: Insufficient documentation

## 2018-09-23 DIAGNOSIS — K2980 Duodenitis without bleeding: Secondary | ICD-10-CM | POA: Insufficient documentation

## 2018-09-23 DIAGNOSIS — R1013 Epigastric pain: Secondary | ICD-10-CM | POA: Insufficient documentation

## 2018-09-23 DIAGNOSIS — R1011 Right upper quadrant pain: Secondary | ICD-10-CM | POA: Diagnosis not present

## 2018-09-23 DIAGNOSIS — R1010 Upper abdominal pain, unspecified: Secondary | ICD-10-CM | POA: Diagnosis present

## 2018-09-23 DIAGNOSIS — Z79899 Other long term (current) drug therapy: Secondary | ICD-10-CM | POA: Insufficient documentation

## 2018-09-23 DIAGNOSIS — R911 Solitary pulmonary nodule: Secondary | ICD-10-CM | POA: Diagnosis not present

## 2018-09-23 LAB — COMPREHENSIVE METABOLIC PANEL
ALT: 28 U/L (ref 0–44)
AST: 32 U/L (ref 15–41)
Albumin: 4.1 g/dL (ref 3.5–5.0)
Alkaline Phosphatase: 51 U/L (ref 38–126)
Anion gap: 8 (ref 5–15)
BILIRUBIN TOTAL: 0.5 mg/dL (ref 0.3–1.2)
BUN: 19 mg/dL (ref 8–23)
CO2: 26 mmol/L (ref 22–32)
CREATININE: 1.18 mg/dL (ref 0.61–1.24)
Calcium: 8.7 mg/dL — ABNORMAL LOW (ref 8.9–10.3)
Chloride: 103 mmol/L (ref 98–111)
GFR calc Af Amer: >60 mL/min (ref >60)
GFR calc non Af Amer: >60 mL/min (ref >60)
Glucose, Bld: 125 mg/dL — ABNORMAL HIGH (ref 70–99)
Potassium: 3.2 mmol/L — ABNORMAL LOW (ref 3.5–5.1)
Sodium: 137 mmol/L (ref 135–145)
Total Protein: 7.5 g/dL (ref 6.5–8.1)

## 2018-09-23 LAB — LIPASE, BLOOD: Lipase: 29 U/L (ref 11–51)

## 2018-09-23 LAB — CBC
HCT: 47.8 % (ref 39.0–52.0)
Hemoglobin: 15.8 g/dL (ref 13.0–17.0)
MCH: 29.5 pg (ref 26.0–34.0)
MCHC: 33.1 g/dL (ref 30.0–36.0)
MCV: 89.3 fL (ref 80.0–100.0)
Platelets: 168 10*3/uL (ref 150–400)
RBC: 5.35 MIL/uL (ref 4.22–5.81)
RDW: 13 % (ref 11.5–15.5)
WBC: 6.3 10*3/uL (ref 4.0–10.5)
nRBC: 0 % (ref 0.0–0.2)

## 2018-09-23 NOTE — ED Triage Notes (Signed)
Pt to the er for upper abd pain in the middle of the abd that moves to the RUQ with diarrhea. Symptoms started Sunday am.

## 2018-09-24 ENCOUNTER — Encounter: Payer: Self-pay | Admitting: Radiology

## 2018-09-24 ENCOUNTER — Emergency Department: Payer: Medicare Other

## 2018-09-24 ENCOUNTER — Emergency Department
Admission: EM | Admit: 2018-09-24 | Discharge: 2018-09-24 | Disposition: A | Discharge status: Home / Self Care | Payer: Medicare Other | Attending: Emergency Medicine | Admitting: Emergency Medicine

## 2018-09-24 DIAGNOSIS — K2980 Duodenitis without bleeding: Secondary | ICD-10-CM

## 2018-09-24 DIAGNOSIS — R1013 Epigastric pain: Secondary | ICD-10-CM | POA: Diagnosis not present

## 2018-09-24 DIAGNOSIS — Z79899 Other long term (current) drug therapy: Secondary | ICD-10-CM | POA: Diagnosis not present

## 2018-09-24 DIAGNOSIS — K7689 Other specified diseases of liver: Secondary | ICD-10-CM | POA: Diagnosis not present

## 2018-09-24 DIAGNOSIS — R1011 Right upper quadrant pain: Secondary | ICD-10-CM

## 2018-09-24 DIAGNOSIS — R911 Solitary pulmonary nodule: Secondary | ICD-10-CM

## 2018-09-24 DIAGNOSIS — I10 Essential (primary) hypertension: Secondary | ICD-10-CM | POA: Diagnosis not present

## 2018-09-24 DIAGNOSIS — R1010 Upper abdominal pain, unspecified: Secondary | ICD-10-CM | POA: Diagnosis present

## 2018-09-24 LAB — URINALYSIS, COMPLETE (UACMP) WITH MICROSCOPIC
BACTERIA UA: NONE SEEN
Bilirubin Urine: NEGATIVE
Glucose, UA: NEGATIVE mg/dL
Ketones, ur: NEGATIVE mg/dL
Leukocytes, UA: NEGATIVE
Nitrite: NEGATIVE
Protein, ur: NEGATIVE mg/dL
Specific Gravity, Urine: 1.02 (ref 1.005–1.030)
Squamous Epithelial / HPF: NONE SEEN (ref 0–5)
pH: 5 (ref 5.0–8.0)

## 2018-09-24 LAB — TROPONIN I: Troponin I: <0.03 ng/mL (ref <0.03)

## 2018-09-24 MED ORDER — AMOXICILLIN 500 MG PO TABS: 1000.0000 mg | ORAL_TABLET | Freq: Two times a day (BID) | ORAL | 0 refills | Status: AC

## 2018-09-24 MED ORDER — MORPHINE SULFATE (PF) 4 MG/ML IV SOLN
4.0000 mg | Freq: Once | INTRAVENOUS | Status: AC
  Administered 2018-09-24: 4 mg via INTRAVENOUS
  Filled 2018-09-24: qty 1

## 2018-09-24 MED ORDER — PANTOPRAZOLE SODIUM 40 MG PO TBEC: 40.0000 mg | DELAYED_RELEASE_TABLET | Freq: Every day | ORAL | 1 refills | Status: DC

## 2018-09-24 MED ORDER — IOPAMIDOL (ISOVUE-300) INJECTION 61%
100.0000 mL | Freq: Once | INTRAVENOUS | Status: AC | PRN
  Administered 2018-09-24: 100 mL via INTRAVENOUS

## 2018-09-24 MED ORDER — CLARITHROMYCIN 500 MG PO TABS: 500.0000 mg | ORAL_TABLET | Freq: Two times a day (BID) | ORAL | 0 refills | Status: AC

## 2018-09-24 MED ORDER — ONDANSETRON HCL 4 MG/2ML IJ SOLN
4.0000 mg | Freq: Once | INTRAMUSCULAR | Status: AC
  Administered 2018-09-24: 4 mg via INTRAVENOUS
  Filled 2018-09-24: qty 2

## 2018-09-24 NOTE — Discharge Instructions (Signed)
Please take both of your antibiotics and your antacid as prescribed for the next 2 weeks and make an appointment to follow-up with your primary care physician within 1 week for recheck.  It is important for your physician to know that today we found pulmonary nodules as well as liver cysts.  These very well could mean nothing but they need to be followed up.  Please return to the emergency department for any concerns.  It was a pleasure to take care of you today, and thank you for coming to our emergency department.  If you have any questions or concerns before leaving please ask the nurse to grab me and I'm more than happy to go through your aftercare instructions again.  If you were prescribed any opioid pain medication today such as Norco, Vicodin, Percocet, morphine, hydrocodone, or oxycodone please make sure you do not drive when you are taking this medication as it can alter your ability to drive safely.  If you have any concerns once you are home that you are not improving or are in fact getting worse before you can make it to your follow-up appointment, please do not hesitate to call 911 and come back for further evaluation.  Merrily Brittle, MD  Results for orders placed or performed during the hospital encounter of 09/24/18  Lipase, blood  Result Value Ref Range   Lipase 29 11 - 51 U/L  Comprehensive metabolic panel  Result Value Ref Range   Sodium 137 135 - 145 mmol/L   Potassium 3.2 (L) 3.5 - 5.1 mmol/L   Chloride 103 98 - 111 mmol/L   CO2 26 22 - 32 mmol/L   Glucose, Bld 125 (H) 70 - 99 mg/dL   BUN 19 8 - 23 mg/dL   Creatinine, Ser 4.27 0.61 - 1.24 mg/dL   Calcium 8.7 (L) 8.9 - 10.3 mg/dL   Total Protein 7.5 6.5 - 8.1 g/dL   Albumin 4.1 3.5 - 5.0 g/dL   AST 32 15 - 41 U/L   ALT 28 0 - 44 U/L   Alkaline Phosphatase 51 38 - 126 U/L   Total Bilirubin 0.5 0.3 - 1.2 mg/dL   GFR calc non Af Amer >60 >60 mL/min   GFR calc Af Amer >60 >60 mL/min   Anion gap 8 5 - 15  CBC  Result  Value Ref Range   WBC 6.3 4.0 - 10.5 K/uL   RBC 5.35 4.22 - 5.81 MIL/uL   Hemoglobin 15.8 13.0 - 17.0 g/dL   HCT 06.2 37.6 - 28.3 %   MCV 89.3 80.0 - 100.0 fL   MCH 29.5 26.0 - 34.0 pg   MCHC 33.1 30.0 - 36.0 g/dL   RDW 15.1 76.1 - 60.7 %   Platelets 168 150 - 400 K/uL   nRBC 0.0 0.0 - 0.2 %  Urinalysis, Complete w Microscopic  Result Value Ref Range   Color, Urine AMBER (A) YELLOW   APPearance HAZY (A) CLEAR   Specific Gravity, Urine 1.020 1.005 - 1.030   pH 5.0 5.0 - 8.0   Glucose, UA NEGATIVE NEGATIVE mg/dL   Hgb urine dipstick SMALL (A) NEGATIVE   Bilirubin Urine NEGATIVE NEGATIVE   Ketones, ur NEGATIVE NEGATIVE mg/dL   Protein, ur NEGATIVE NEGATIVE mg/dL   Nitrite NEGATIVE NEGATIVE   Leukocytes, UA NEGATIVE NEGATIVE   RBC / HPF 0-5 0 - 5 RBC/hpf   WBC, UA 0-5 0 - 5 WBC/hpf   Bacteria, UA NONE SEEN NONE SEEN  Squamous Epithelial / LPF NONE SEEN 0 - 5   Mucus PRESENT    Hyaline Casts, UA PRESENT   Troponin I - ONCE - STAT  Result Value Ref Range   Troponin I <0.03 <0.03 ng/mL   Ct Abdomen Pelvis W Contrast  Result Date: 09/24/2018 CLINICAL DATA:  Acute onset of upper abdominal pain, radiating to the right upper quadrant. Diarrhea. EXAM: CT ABDOMEN AND PELVIS WITH CONTRAST TECHNIQUE: Multidetector CT imaging of the abdomen and pelvis was performed using the standard protocol following bolus administration of intravenous contrast. CONTRAST:  100mL ISOVUE-300 IOPAMIDOL (ISOVUE-300) INJECTION 61% COMPARISON:  Right upper quadrant ultrasound performed earlier today at 12:44 a.m. FINDINGS: Lower chest: Pleural based nodules are noted at the left lung base, measuring up to 9 mm in size (image 2 of 25). The visualized portions of the mediastinum are unremarkable. Hepatobiliary: Numerous scattered hypodensities are noted throughout the liver, measuring up to 1.9 cm in size. The gallbladder is unremarkable in appearance. The common bile duct is normal in caliber. Pancreas: The pancreas  is within normal limits. Spleen: The spleen is unremarkable in appearance. Adrenals/Urinary Tract: The adrenal glands are unremarkable in appearance. The kidneys are within normal limits. There is no evidence of hydronephrosis. No renal or ureteral stones are identified. No perinephric stranding is seen. Stomach/Bowel: The stomach is unremarkable in appearance. There is wall thickening along the first segment of the duodenum, of uncertain significance. If the patient has associated symptoms, endoscopy could be considered to assess for underlying ulceration. The remaining small bowel is unremarkable. The appendix is normal in caliber, without evidence of appendicitis. The colon is unremarkable in appearance. Vascular/Lymphatic: The abdominal aorta is unremarkable in appearance. The inferior vena cava is grossly unremarkable. No retroperitoneal lymphadenopathy is seen. No pelvic sidewall lymphadenopathy is identified. Reproductive: The bladder is mildly distended. Mild bladder wall thickening could reflect cystitis. The prostate impresses on the base of the bladder, measuring 4.9 cm in transverse dimension. Other: No additional soft tissue abnormalities are seen. Musculoskeletal: No acute osseous abnormalities are identified. Facet disease is noted along the lumbar spine. The visualized musculature is unremarkable in appearance. IMPRESSION: 1. Wall thickening along the first segment of the duodenum, of uncertain significance. This could reflect duodenitis. If the patient's symptoms persist after treatment, endoscopy could be considered to assess for underlying ulceration or mass. 2. Mild bladder wall thickening could reflect cystitis. 3. Numerous scattered hypodensities throughout the liver, measuring up to 1.9 cm in size. Would correlate with LFTs, and consider dynamic liver protocol MRI or CT for further evaluation, when and as deemed clinically appropriate. 4. Pleural based nodules at the left lung base, measuring  up to 9 mm in size. Non-contrast chest CT at 3-6 months is recommended. If the nodules are stable at time of repeat CT, then future CT at 18-24 months (from today's scan) is considered optional for low-risk patients, but is recommended for high-risk patients. This recommendation follows the consensus statement: Guidelines for Management of Incidental Pulmonary Nodules Detected on CT Images: From the Fleischner Society 2017; Radiology 2017; 284:228-243. 5. Borderline enlarged prostate. Electronically Signed   By: Roanna RaiderJeffery  Chang M.D.   On: 09/24/2018 01:59   [place] Abdomen Limited Ruq  Result Date: 09/24/2018 CLINICAL DATA:  Right upper quadrant pain for 4 days EXAM: ULTRASOUND ABDOMEN LIMITED RIGHT UPPER QUADRANT COMPARISON:  None. FINDINGS: Gallbladder: No gallstones or wall thickening visualized. No sonographic Murphy sign noted by sonographer. Common bile duct: Diameter: 4 mm Liver: Multiple cystic foci  in the liver, measuring up to 1.9 x 1.3 x 0.9 cm. Portal vein is patent on color Doppler imaging with normal direction of blood flow towards the liver. IMPRESSION: No cholelithiasis or other evidence of acute cholecystitis. Electronically Signed   By: Deatra Robinson M.D.   On: 09/24/2018 01:22

## 2018-09-24 NOTE — ED Notes (Signed)
Discharge papers discussed with patient. Discharge delayed d/t patient needing pain medication after discussing pain level at time of discharge.

## 2018-09-24 NOTE — ED Provider Notes (Signed)
[facility] Emergency Department Provider Note  ____________________________________________   First MD Initiated Contact with Patient 09/24/18 [id number]     (approximate)  I have reviewed the triage vital signs and the nursing notes.   HISTORY  Chief Complaint Abdominal Pain   HPI Lucas Matthews is a 73 y.o. male presents to the emergency department with upper abdominal pain radiating to his right upper quadrant associated with diarrhea that began roughly 48 hours ago.  Symptoms have been gradual onset slowly progressive are now moderate severity and intermittent.  Nothing in particular seems to make them better or worse.  He has no history of abdominal surgeries.  No fevers or chills.  His pain is described as aching and burning.  He does have some foul taste in his mouth.  He has a remote colonoscopy although denies endoscopy.   Past Medical History:  Diagnosis Date  . Hypertension     There are no active problems to display for this patient.   Past Surgical History:  Procedure Laterality Date  . COLONOSCOPY  2009   [place] in [place]    Prior to Admission medications   Medication Sig Start Date End Date Taking? Authorizing Provider  alfuzosin (UROXATRAL) 10 MG 24 hr tablet Take 10 mg by mouth daily with breakfast.    [provider]  amoxicillin (AMOXIL) 500 MG tablet Take 2 tablets (1,000 mg total) by mouth 2 (two) times daily for 14 days. 09/24/18 10/08/18  Merrily Brittleifenbark, Arabelle Bollig, MD  cetirizine-pseudoephedrine (ZYRTEC-D) 5-120 MG tablet Take 1 tablet by mouth 2 (two) times daily as needed for allergies or rhinitis. 06/26/17   Molpus, Jonny RuizJohn, MD  clarithromycin (BIAXIN) 500 MG tablet Take 1 tablet (500 mg total) by mouth 2 (two) times daily for 14 days. 09/24/18 10/08/18  Merrily Brittleifenbark, Marykatherine Sherwood, MD  diclofenac sodium (VOLTAREN) 1 % GEL Apply 1 application topically 4 (four) times daily. 03/19/14   Hayden RasmussenMabe, David, NP  hydrochlorothiazide (HYDRODIURIL) 25 MG tablet  Take 25 mg by mouth daily.    [provider]  naproxen (NAPROSYN) 375 MG tablet Take 1 tablet (375 mg total) by mouth 2 (two) times daily. Prn muscle pain. Take with food. 03/19/14   Hayden RasmussenMabe, David, NP  pantoprazole (PROTONIX) 40 MG tablet Take 1 tablet (40 mg total) by mouth daily. 09/24/18 09/24/19  Merrily Brittleifenbark, Oleda Borski, MD  traMADol (ULTRAM) 50 MG tablet Take 1 tablet (50 mg total) by mouth every 6 (six) hours as needed. 03/19/14   Hayden RasmussenMabe, David, NP    Allergies Patient has no known allergies.  No family history on file.  Social History Social History   Tobacco Use  . Smoking status: Never Smoker  . Smokeless tobacco: Never Used  Substance Use Topics  . Alcohol use: Yes    Comment: rare  . Drug use: No    Review of Systems Constitutional: No fever/chills Eyes: No visual changes. ENT: No sore throat. Cardiovascular: Denies chest pain. Respiratory: Denies shortness of breath. Gastrointestinal: Positive for abdominal pain.  Positive for nausea, no vomiting.  Positive for diarrhea.  No constipation. Genitourinary: Negative for dysuria. Musculoskeletal: Negative for back pain. Skin: Negative for rash. Neurological: Negative for headaches, focal weakness or numbness.   ____________________________________________   PHYSICAL EXAM:  VITAL SIGNS: ED Triage Vitals  Enc Vitals Group     BP 09/23/18 2306 125/70     Pulse Rate 09/23/18 2306 87     Resp 09/23/18 2306 15     Temp 09/23/18 2306 98.2 F (  36.8 C)     Temp Source 09/23/18 2306 Oral     SpO2 09/23/18 2306 100 %     Weight 09/23/18 2306 155 lb (70.3 kg)     Height 09/23/18 2306 5\' 7"  (1.702 m)     Head Circumference --      Peak Flow --      Pain Score 09/23/18 2319 0     Pain Loc --      Pain Edu? --      Excl. in GC? --     Constitutional: Alert and oriented x4 appears somewhat uncomfortable though nontoxic no diaphoresis speaks in full clear sentences Eyes: PERRL EOMI. Head: Atraumatic. Nose: No  congestion/rhinnorhea. Mouth/Throat: No trismus Neck: No stridor.   Cardiovascular: Normal rate, regular rhythm. Grossly normal heart sounds.  Good peripheral circulation. Respiratory: Normal respiratory effort.  No retractions. Lungs CTAB and moving good air Gastrointestinal: Soft somewhat tender in his upper abdomen although negative Murphy's.  No McBurney's tenderness negative Rovsing's Musculoskeletal: No lower extremity edema   Neurologic:  Normal speech and language. No gross focal neurologic deficits are appreciated. Skin:  Skin is warm, dry and intact. No rash noted. Psychiatric: Mood and affect are normal. Speech and behavior are normal.    ____________________________________________   DIFFERENTIAL includes but not limited to  Biliary colic, cholecystitis, cholangitis, appendicitis, diverticulitis ____________________________________________   LABS (all labs ordered are listed, but only abnormal results are displayed)  Labs Reviewed  COMPREHENSIVE METABOLIC PANEL - Abnormal; Notable for the following components:      Result Value   Potassium 3.2 (*)    Glucose, Bld 125 (*)    Calcium 8.7 (*)    All other components within normal limits  URINALYSIS, COMPLETE (UACMP) WITH MICROSCOPIC - Abnormal; Notable for the following components:   Color, Urine AMBER (*)    APPearance HAZY (*)    Hgb urine dipstick SMALL (*)    All other components within normal limits  LIPASE, BLOOD  CBC  TROPONIN I    Lab work reviewed by me with slightly low potassium otherwise unremarkable __________________________________________  EKG  ED ECG REPORT I, Merrily BrittleNeil Zetha Kuhar, the attending physician, personally viewed and interpreted this ECG.  Date: 09/27/2018 EKG Time:  Rate: 88 Rhythm: normal sinus rhythm QRS Axis: Leftward axis Intervals: normal ST/T Wave abnormalities: normal Narrative Interpretation: no evidence of acute  ischemia  ____________________________________________  RADIOLOGY  Right upper quadrant ultrasound reviewed by me with no acute disease CT abdomen pelvis reviewed by me consistent with duodenitis ____________________________________________   PROCEDURES  Procedure(s) performed: no  Procedures  Critical Care performed: no  ____________________________________________   INITIAL IMPRESSION / ASSESSMENT AND PLAN / ED COURSE  Pertinent labs & imaging results that were available during my care of the patient were reviewed by me and considered in my medical decision making (see chart for details).   As part of my medical decision making, I reviewed the following data within the electronic MEDICAL RECORD NUMBER History obtained from family if available, nursing notes, old chart and ekg, as well as notes from prior ED visits.  Patient comes to the emergency department with epigastric pain radiating to his right upper quadrant along with loose stools for the past 48 hours.  He has no red flags for C. difficile.  Given his migratory pain in addition to morphine and Zofran I obtained a right upper quadrant ultrasound which fortunately is negative.  Given the uncertainty of his pain and the negative ultrasound I  then obtained a CT scan which is concerning for duodenitis.  It also shows multiple incidentaloma's such as pulmonary nodule and liver cyst.  I discussed with the patient the diagnosis of duodenitis and we discussed triple therapy with amoxicillin, clarithromycin, and Protonix however he understands that regardless of the results of his treatment he requires follow-up with gastroenterology for likely endoscopy.  I disclosed the incidental findings and he understands to follow-up with his primary care physician.  He is discharged home in improved condition with strict return precautions.      ____________________________________________   FINAL CLINICAL IMPRESSION(S) / ED DIAGNOSES  Final  diagnoses:  RUQ pain  Epigastric pain  Duodenitis  Liver cyst  Pulmonary nodule      NEW MEDICATIONS STARTED DURING THIS VISIT:  Discharge Medication List as of 09/24/2018  3:05 AM    START taking these medications   Details  amoxicillin (AMOXIL) 500 MG tablet Take 2 tablets (1,000 mg total) by mouth 2 (two) times daily for 14 days., Starting Wed 09/24/2018, Until Wed 10/08/2018, Print    clarithromycin (BIAXIN) 500 MG tablet Take 1 tablet (500 mg total) by mouth 2 (two) times daily for 14 days., Starting Wed 09/24/2018, Until Wed 10/08/2018, Print    pantoprazole (PROTONIX) 40 MG tablet Take 1 tablet (40 mg total) by mouth daily., Starting Wed 09/24/2018, Until Thu 09/24/2019, Print         Note:  This document was prepared using Dragon voice recognition software and may include unintentional dictation errors.    Merrily Brittle, MD 09/27/18 [phone]

## 2018-10-01 ENCOUNTER — Other Ambulatory Visit: Payer: Self-pay | Admitting: Family Medicine

## 2018-10-01 DIAGNOSIS — R160 Hepatomegaly, not elsewhere classified: Secondary | ICD-10-CM

## 2018-10-11 ENCOUNTER — Ambulatory Visit: Payer: Medicare Other

## 2018-10-15 ENCOUNTER — Ambulatory Visit
Admission: RE | Admit: 2018-10-15 | Discharge: 2018-10-15 | Disposition: A | Discharge status: Home / Self Care | Payer: Medicare Other | Source: Ambulatory Visit | Attending: Family Medicine | Admitting: Family Medicine

## 2018-10-15 DIAGNOSIS — R160 Hepatomegaly, not elsewhere classified: Secondary | ICD-10-CM | POA: Insufficient documentation

## 2018-10-15 MED ORDER — GADOBUTROL 1 MMOL/ML IV SOLN
7.0000 mL | Freq: Once | INTRAVENOUS | Status: AC | PRN
  Administered 2018-10-15: 7 mL via INTRAVENOUS

## 2019-11-19 ENCOUNTER — Ambulatory Visit: Payer: Medicare Other

## 2019-11-21 ENCOUNTER — Ambulatory Visit: Payer: Medicare Other | Attending: Internal Medicine

## 2019-11-21 DIAGNOSIS — Z23 Encounter for immunization: Secondary | ICD-10-CM | POA: Insufficient documentation

## 2019-11-21 NOTE — Progress Notes (Signed)
   Covid-19 Vaccination Clinic  Name:  Lucas Matthews    MRN: [id number] DOB: 1946/09/15  11/21/2019  [name] was observed post Covid-19 immunization for 15 minutes without incidence. He was provided with Vaccine Information Sheet and instruction to access the V-Safe system.   [name] was instructed to call 911 with any severe reactions post vaccine: Marland Kitchen Difficulty breathing  . Swelling of your face and throat  . A fast heartbeat  . A bad rash all over your body  . Dizziness and weakness    Immunizations Administered    Name Date Dose VIS Date Route   Moderna COVID-19 Vaccine 11/21/2019  9:53 AM 0.5 mL 08/25/2019 Intramuscular   Manufacturer: Moderna   Lot: [id number]   NDC: 25189-842-10

## 2019-11-26 ENCOUNTER — Ambulatory Visit: Payer: Medicare Other

## 2019-12-19 ENCOUNTER — Ambulatory Visit: Payer: Medicare Other

## 2019-12-19 ENCOUNTER — Ambulatory Visit: Payer: Medicare Other | Attending: Internal Medicine

## 2019-12-19 DIAGNOSIS — Z23 Encounter for immunization: Secondary | ICD-10-CM

## 2019-12-19 NOTE — Progress Notes (Signed)
   Covid-19 Vaccination Clinic  Name:  Lucas Matthews    MRN: [id number] DOB: July 04, 1946  12/19/2019  [name] was observed post Covid-19 immunization for 15 minutes without incident. He was provided with Vaccine Information Sheet and instruction to access the V-Safe system.   [name] was instructed to call 911 with any severe reactions post vaccine: Marland Kitchen Difficulty breathing  . Swelling of face and throat  . A fast heartbeat  . A bad rash all over body  . Dizziness and weakness   Immunizations Administered    Name Date Dose VIS Date Route   Moderna COVID-19 Vaccine 12/19/2019  2:43 PM 0.5 mL 08/25/2019 Intramuscular   Manufacturer: Gala Murdoch   Lot: [id number]   NDC: 09233-007-62

## 2019-12-22 ENCOUNTER — Ambulatory Visit: Payer: Medicare Other

## 2020-11-11 IMAGING — MR MR ABDOMEN WO/W CM
17 series · 48 of 48 positions shown · IV contrast (Contrast agent)
Comparison: Multiple exams, including 09/24/2018

CLINICAL DATA: Liver lesions on ultrasound and CT scan, for further
characterization.

EXAM:
MRI ABDOMEN WITHOUT AND WITH CONTRAST
TECHNIQUE: Multiplanar multisequence MR imaging of the abdomen was performed
both before and after the administration of intravenous contrast.
CONTRAST:  7 cc Gadavist

[Series 2: T2 · coronal · 6.0mm · 1.19mm/px · 2 of 30 slices shown (1 of 2)]
[im 1/30]
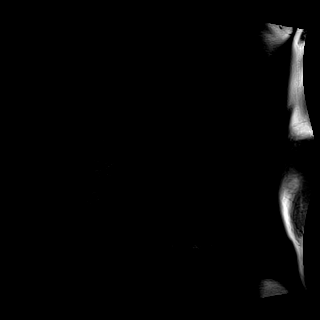
[im 30/30]
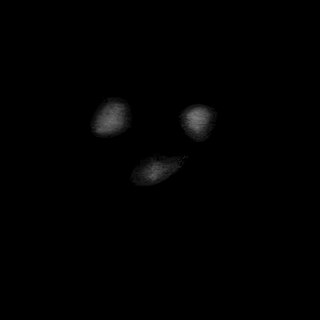

[Series 3: T2 · axial · 6.0mm · 1.19mm/px · z∈[-78,+145]mm · 2 of 32 slices shown (2 of 2)]
[im 1/32]
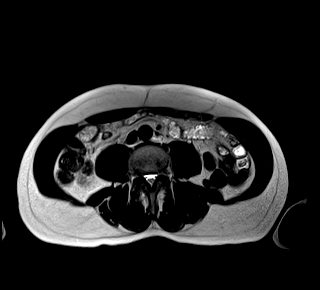
[im 32/32]
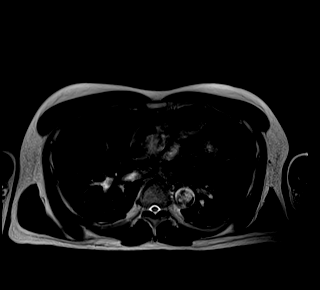

[Series 5: T2 fat-sat · axial · 6.0mm · 1.19mm/px · z∈[-85,+152]mm · 2 of 34 slices shown]
[im 1/34]
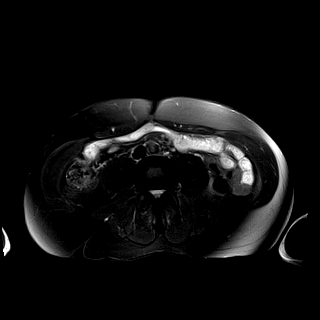
[im 34/34]
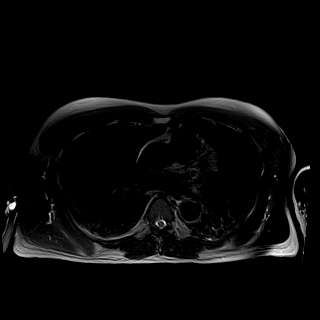

[Series 6: ax dwi_tracew · axial · 6.0mm · 1.42mm/px · z∈[-85,+152]mm · 5 of 102 slices shown]
[im 1/102]
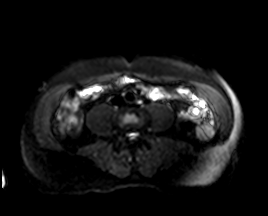
[im 26/102]
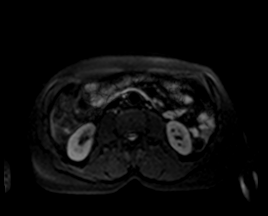
[im 51/102]
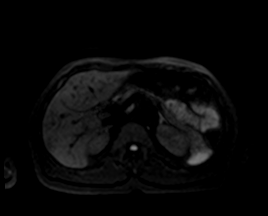
[im 76/102]
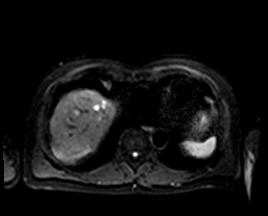
[im 102/102]
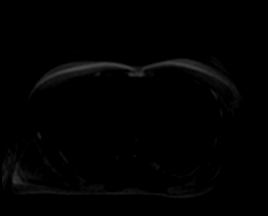

[Series 7: ax dwi_adc · axial · 6.0mm · 1.42mm/px · z∈[-85,+152]mm · 2 of 34 slices shown]
[im 1/34]
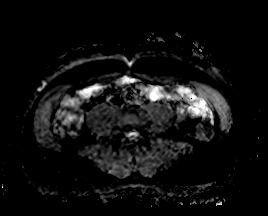
[im 34/34]
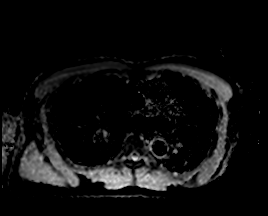

[Series 8: T1 · axial · 6.0mm · 0.74mm/px · z∈[-78,+145]mm · 4 of 64 slices shown]
[im 1/64]
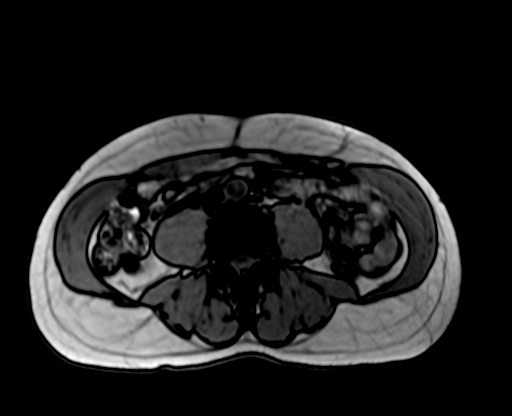
[im 22/64]
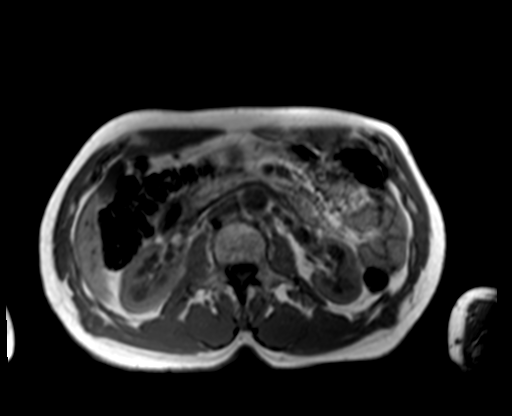
[im 43/64]
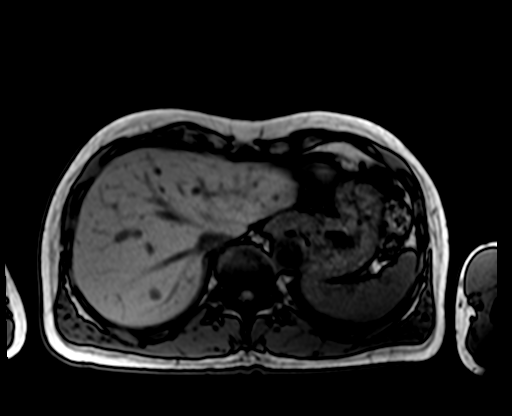
[im 64/64]
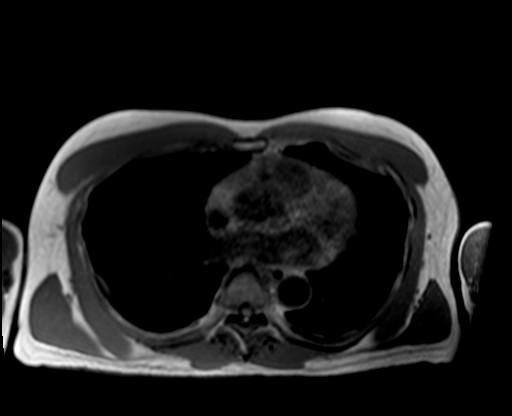

[Series 9: bSSFP · axial · 6.0mm · 0.74mm/px · 1 of 32 slices shown]
[im 1/32]
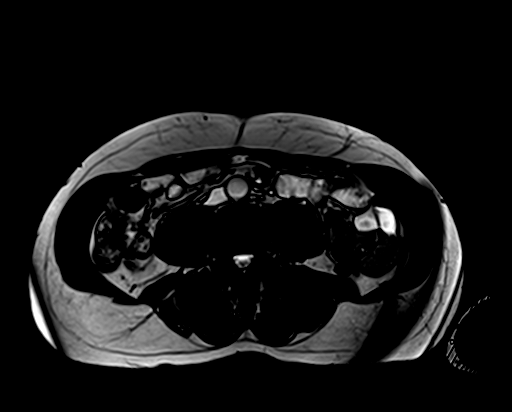

[Series 10: T1 dynamic fat-sat · axial · non-contrast · 3.0mm · 1.19mm/px · z∈[-85,+152]mm · 3 of 80 slices shown (1 of 5)]
[im 1/80]
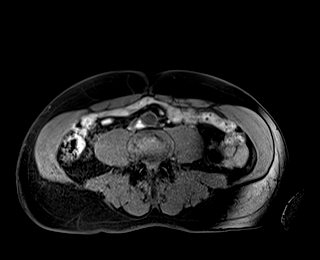
[im 40/80]
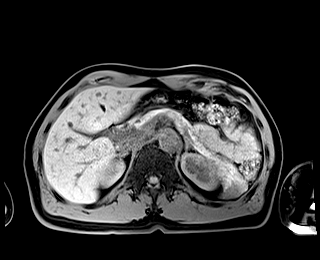
[im 80/80]
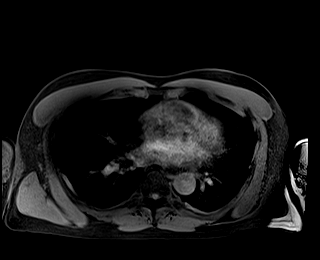

[Series 11: T1 dynamic fat-sat post-contrast · axial · 3.0mm · 1.19mm/px · z∈[-85,+152]mm · 3 of 80 slices shown (1 of 4)]
[im 1/80]
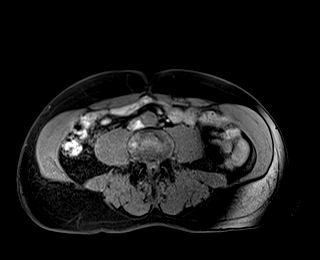
[im 40/80]
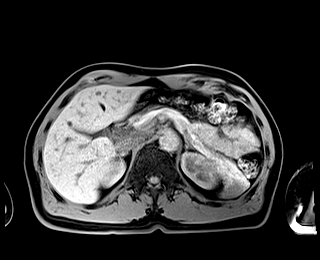
[im 80/80]
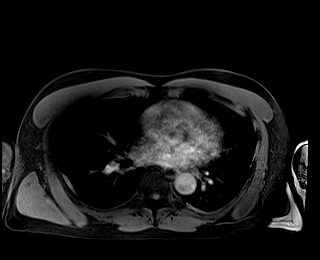

[Series 12: T1 dynamic fat-sat · axial · 3.0mm · 1.19mm/px · z∈[-85,+152]mm · 3 of 80 slices shown (2 of 5)]
[im 1/80]
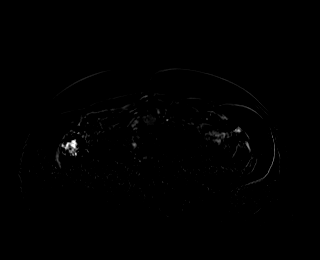
[im 40/80]
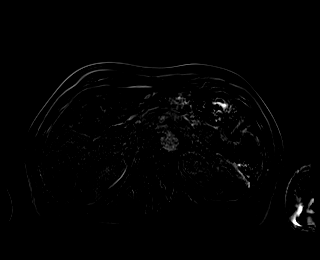
[im 80/80]
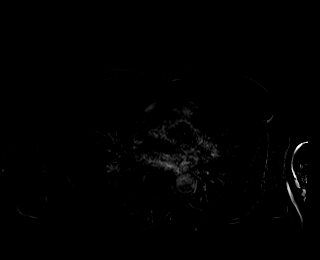

[Series 13: T1 dynamic fat-sat post-contrast · axial · 3.0mm · 1.19mm/px · z∈[-85,+152]mm · 3 of 80 slices shown (2 of 4)]
[im 1/80]
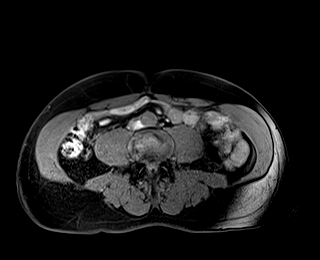
[im 40/80]
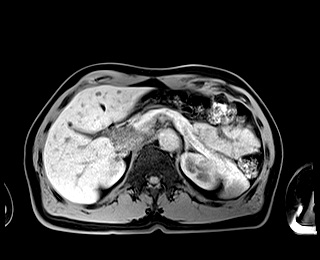
[im 80/80]
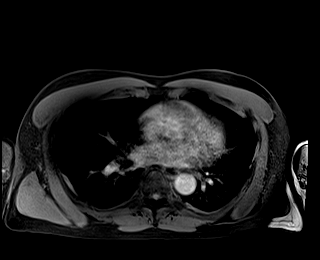

[Series 14: T1 dynamic fat-sat · axial · 3.0mm · 1.19mm/px · z∈[-85,+152]mm · 3 of 80 slices shown (3 of 5)]
[im 1/80]
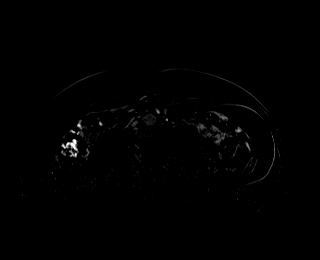
[im 40/80]
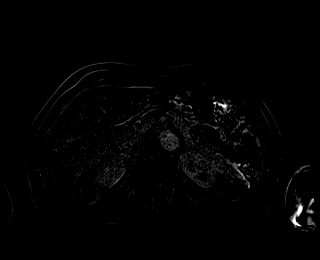
[im 80/80]
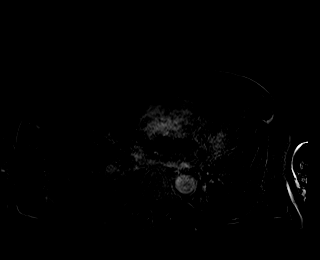

[Series 15: T1 dynamic fat-sat post-contrast · axial · 3.0mm · 1.19mm/px · z∈[-85,+152]mm · 3 of 80 slices shown (3 of 4)]
[im 1/80]
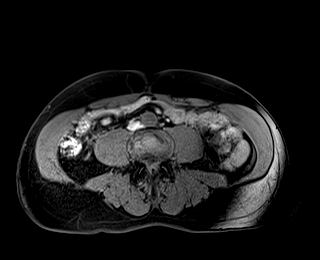
[im 40/80]
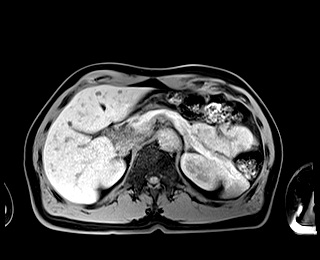
[im 80/80]
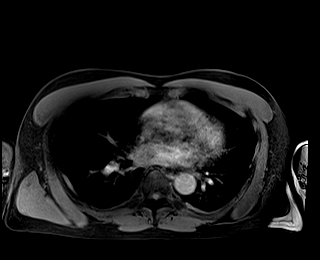

[Series 16: T1 dynamic fat-sat · axial · 3.0mm · 1.19mm/px · z∈[-85,+152]mm · 3 of 80 slices shown (4 of 5)]
[im 1/80]
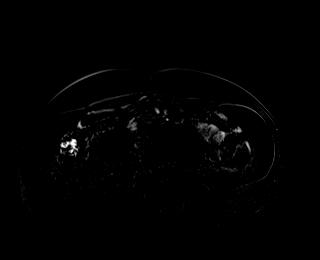
[im 40/80]
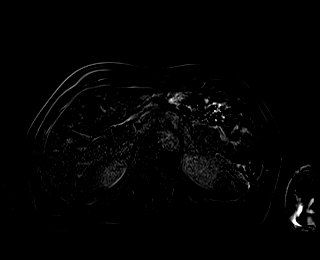
[im 80/80]
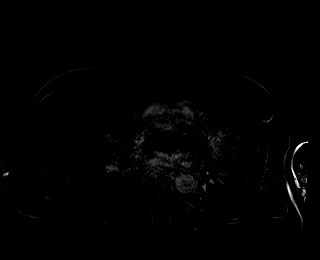

[Series 17: T1 dynamic post-contrast · coronal · 3.0mm · 1.31mm/px · 3 of 72 slices shown]
[im 1/72]
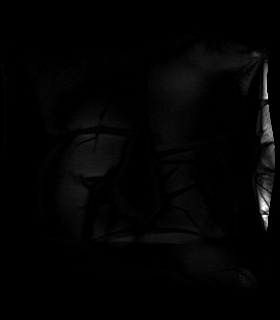
[im 36/72]
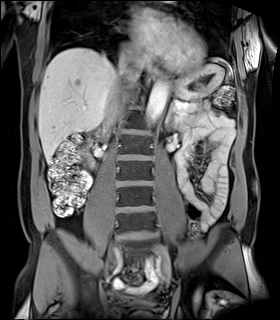
[im 72/72]
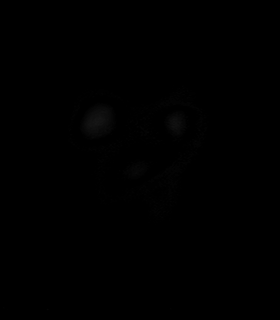

[Series 18: T1 dynamic fat-sat post-contrast · axial · 3.0mm · 1.19mm/px · z∈[-85,+152]mm · 3 of 80 slices shown (4 of 4)]
[im 1/80]
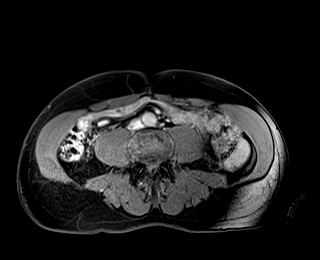
[im 40/80]
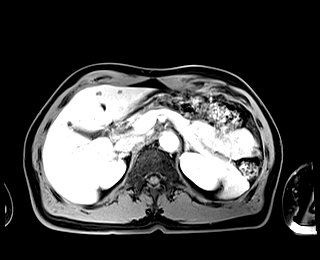
[im 80/80]
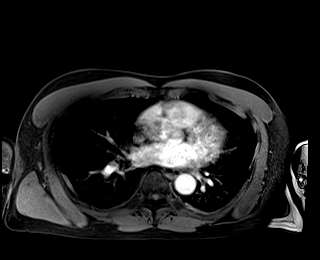

[Series 19: T1 dynamic fat-sat · axial · 3.0mm · 1.19mm/px · z∈[-85,+152]mm · 3 of 80 slices shown (5 of 5)]
[im 1/80]
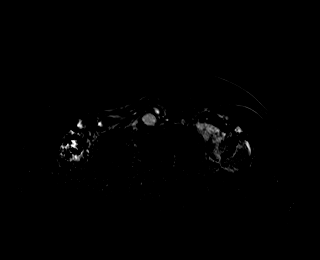
[im 40/80]
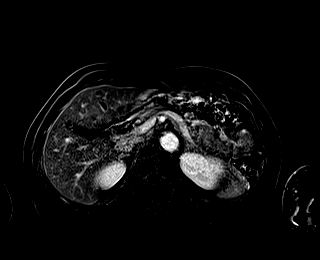
[im 80/80]
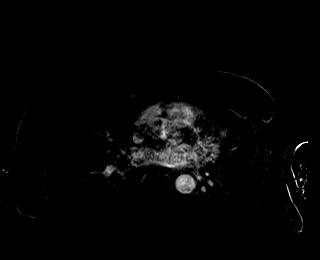

[48 of 48 positions shown; findings below may reference images not displayed]

FINDINGS: Lower chest: Pleural-based nodules along both lung bases not
appreciably changed from 09/24/2018. Calcified noncalcified pleural
plaque along the right hemidiaphragm. Borderline cardiomegaly.

Hepatobiliary: Innumerable nonenhancing small cysts throughout the
liver. No appreciable internal nodularity. One of the largest
measures 1.8 by 0.8 cm in the left hepatic lobe on image 31/18.

The gallbladder appears unremarkable. No significant biliary
dilatation.

Pancreas:  Unremarkable

Spleen:  Unremarkable

Adrenals/Urinary Tract:  Unremarkable

Stomach/Bowel: Unremarkable. No current duodenal wall thickening or
abnormal enhancement.

Vascular/Lymphatic:  Unremarkable

Other:  No supplemental non-categorized findings.

Musculoskeletal: Unremarkable
IMPRESSION: 1. Innumerable nonenhancing small cysts in the liver favoring
polycystic liver disease.
2. Unremarkable current appearance of the pancreas and duodenum.
3. Pleural-based nodules at the lung bases, along with calcified
pleural plaques along the right hemidiaphragm. The presence of the
calcified pleural plaques makes these most likely to be due to prior
asbestos related pleural disease, but surveillance according to the
original recommended time frame by chest CT continues to be
recommended.
4. Borderline cardiomegaly.

## 2022-12-07 ENCOUNTER — Emergency Department: Payer: Medicare Other

## 2022-12-07 ENCOUNTER — Encounter: Payer: Self-pay | Admitting: Emergency Medicine

## 2022-12-07 ENCOUNTER — Other Ambulatory Visit: Payer: Self-pay

## 2022-12-07 ENCOUNTER — Emergency Department
Admission: EM | Admit: 2022-12-07 | Discharge: 2022-12-07 | Disposition: A | Discharge status: Home / Self Care | Payer: Medicare Other | Attending: Emergency Medicine | Admitting: Emergency Medicine

## 2022-12-07 DIAGNOSIS — Z20822 Contact with and (suspected) exposure to covid-19: Secondary | ICD-10-CM | POA: Diagnosis not present

## 2022-12-07 DIAGNOSIS — R059 Cough, unspecified: Secondary | ICD-10-CM | POA: Diagnosis present

## 2022-12-07 DIAGNOSIS — J40 Bronchitis, not specified as acute or chronic: Secondary | ICD-10-CM | POA: Insufficient documentation

## 2022-12-07 LAB — RESP PANEL BY RT-PCR (RSV, FLU A&B, COVID)  RVPGX2
Influenza A by PCR: NEGATIVE
Influenza B by PCR: NEGATIVE
Resp Syncytial Virus by PCR: NEGATIVE
SARS Coronavirus 2 by RT PCR: NEGATIVE

## 2022-12-07 MED ORDER — ALBUTEROL SULFATE HFA 108 (90 BASE) MCG/ACT IN AERS: 2.0000 | INHALATION_SPRAY | Freq: Four times a day (QID) | RESPIRATORY_TRACT | 2 refills | Status: AC | PRN

## 2022-12-07 MED ORDER — IPRATROPIUM-ALBUTEROL 0.5-2.5 (3) MG/3ML IN SOLN
3.0000 mL | Freq: Once | RESPIRATORY_TRACT | Status: AC
  Administered 2022-12-07: 3 mL via RESPIRATORY_TRACT
  Filled 2022-12-07: qty 3

## 2022-12-07 MED ORDER — PREDNISONE 20 MG PO TABS
20.0000 mg | ORAL_TABLET | Freq: Once | ORAL | Status: AC
  Administered 2022-12-07: 20 mg via ORAL
  Filled 2022-12-07: qty 1

## 2022-12-07 MED ORDER — PREDNISONE 10 MG PO TABS: 20.0000 mg | ORAL_TABLET | Freq: Every day | ORAL | 0 refills | Status: AC

## 2022-12-07 NOTE — ED Provider Notes (Signed)
   [facility] Provider Note    Event Date/Time   First MD Initiated Contact with Patient 12/07/22 1413     (approximate)   History   Cough   HPI  Lucas Matthews is a 77 y.o. male with a history of high blood pressure who presents with complaints of a cough for approximately 2 to 3 months.  Patient reports it is an intermittent nagging cough.  No shortness of breath.  No pleurisy.  No chest pain.  No lower extremity swelling.     Physical Exam   Triage Vital Signs: ED Triage Vitals [12/07/22 1403]  Enc Vitals Group     BP (!) 137/58     Pulse Rate 68     Resp 18     Temp 98.5 F (36.9 C)     Temp Source Oral     SpO2 96 %     Weight      Height      Head Circumference      Peak Flow      Pain Score 0     Pain Loc      Pain Edu?      Excl. in [place]?     Most recent vital signs: Vitals:   12/07/22 1403  BP: (!) 137/58  Pulse: 68  Resp: 18  Temp: 98.5 F (36.9 C)  SpO2: 96%     General: Awake, no distress.  CV:  Good peripheral perfusion.  Resp:  Normal effort.  Clear to auscultation bilaterally Abd:  No distention.  Other:     ED Results / Procedures / Treatments   Labs (all labs ordered are listed, but only abnormal results are displayed) Labs Reviewed  RESP PANEL BY RT-PCR (RSV, FLU A&B, COVID)  [id number]     EKG     RADIOLOGY X-ray viewed interpreted by me, no acute abnormality    PROCEDURES:  Critical Care performed:   Procedures   MEDICATIONS ORDERED IN ED: Medications  ipratropium-albuterol (DUONEB) 0.5-2.5 (3) MG/3ML nebulizer solution 3 mL (3 mLs Nebulization Given 12/07/22 1503)  predniSONE (DELTASONE) tablet 20 mg (20 mg Oral Given 12/07/22 1444)     IMPRESSION / MDM / ASSESSMENT AND PLAN / ED COURSE  I reviewed the triage vital signs and the nursing notes. Patient's presentation is most consistent with exacerbation of chronic illness.  Patient with several months of cough without chest  pain or pleurisy or shortness of breath.  Suspicious for bronchitis, less likely pneumonia, doubt PE given no tachycardia, no shortness of breath, no pleurisy, no hemoptysis, no leg swelling  X-ray is negative for pneumonia, no acute abnormalities noted, patient treated with prednisone and DuoNeb, close outpatient follow-up recommended        FINAL CLINICAL IMPRESSION(S) / ED DIAGNOSES   Final diagnoses:  Bronchitis     Rx / DC Orders   ED Discharge Orders          Ordered    predniSONE (DELTASONE) 10 MG tablet  Daily        12/07/22 1505    albuterol (VENTOLIN HFA) 108 (90 Base) MCG/ACT inhaler  Every 6 hours PRN        12/07/22 1505             Note:  This document was prepared using Dragon voice recognition software and may include unintentional dictation errors.   Lavonia Drafts, MD 12/07/22 [phone]

## 2022-12-07 NOTE — ED Triage Notes (Signed)
Patient to ED for non-productive cough. Patient states cough has been since December. No other complaints.

## 2023-05-29 ENCOUNTER — Encounter: Payer: Self-pay | Admitting: Emergency Medicine

## 2023-05-29 ENCOUNTER — Other Ambulatory Visit: Payer: Self-pay

## 2023-05-29 ENCOUNTER — Emergency Department
Admission: EM | Admit: 2023-05-29 | Discharge: 2023-05-29 | Disposition: A | Discharge status: Home / Self Care | Payer: Medicare Other | Attending: Emergency Medicine | Admitting: Emergency Medicine

## 2023-05-29 DIAGNOSIS — I10 Essential (primary) hypertension: Secondary | ICD-10-CM | POA: Diagnosis not present

## 2023-05-29 DIAGNOSIS — M79604 Pain in right leg: Secondary | ICD-10-CM | POA: Diagnosis present

## 2023-05-29 DIAGNOSIS — M5416 Radiculopathy, lumbar region: Secondary | ICD-10-CM | POA: Diagnosis not present

## 2023-05-29 NOTE — ED Provider Notes (Signed)
[facility] Provider Note    Event Date/Time   First MD Initiated Contact with Patient 05/29/23 1503     (approximate)   History   Chief Complaint Leg Pain   HPI  Lucas Matthews is a 77 y.o. male with past medical history of hypertension who presents to the ED complaining of leg pain.  Patient reports that for the past couple of weeks he has been dealing with intermittent pain in the middle of his posterior thigh.  He states that the pain seems to be exacerbated by certain movements but he denies any pain in his lower back or pain shooting down his leg.  He has not had any trauma to the area, does state that he is frequently bending over and lifting things at work.  He states that sometimes the leg will give out on him when he has an episode of pain, but he denies any numbness in his groin and has not had any bowel or bladder incontinence.  He denies any weakness in his leg currently, states he has been able to walk without difficulty.  He has never had similar symptoms in the past.     Physical Exam   Triage Vital Signs: ED Triage Vitals [05/29/23 1428]  Encounter Vitals Group     BP 126/67     Systolic BP Percentile      Diastolic BP Percentile      Pulse Rate 84     Resp 18     Temp 98.6 F (37 C)     Temp Source Oral     SpO2 94 %     Weight 158 lb (71.7 kg)     Height 5\' 7"  (1.702 m)     Head Circumference      Peak Flow      Pain Score 0     Pain Loc      Pain Education      Exclude from Growth Chart     Most recent vital signs: Vitals:   05/29/23 1428 05/29/23 1554  BP: 126/67 (!) 141/74  Pulse: 84 (!) 53  Resp: 18 18  Temp: 98.6 F (37 C) 98.6 F (37 C)  SpO2: 94% 96%    Constitutional: Alert and oriented. Eyes: Conjunctivae are normal. Head: Atraumatic. Nose: No congestion/rhinnorhea. Mouth/Throat: Mucous membranes are moist.  Cardiovascular: Normal rate, regular rhythm. Grossly normal heart sounds.  2+ radial  and DP pulses bilaterally. Respiratory: Normal respiratory effort.  No retractions. Lungs CTAB. Gastrointestinal: Soft and nontender. No distention. Musculoskeletal: No lower extremity tenderness nor edema.  No midline lumbar spinal tenderness to palpation. Neurologic:  Normal speech and language. No gross focal neurologic deficits are appreciated.    ED Results / Procedures / Treatments   Labs (all labs ordered are listed, but only abnormal results are displayed) Labs Reviewed - No data to display   PROCEDURES:  Critical Care performed: No  Procedures   MEDICATIONS ORDERED IN ED: Medications - No data to display   IMPRESSION / MDM / ASSESSMENT AND PLAN / ED COURSE  I reviewed the triage vital signs and the nursing notes.                              77 y.o. male with past medical history of hypertension who presents to the ED complaining of intermittent pain in his right posterior thigh for the past 2 weeks that will  occasionally cause his leg to give out on him.  Patient's presentation is most consistent with acute, uncomplicated illness.  Differential diagnosis includes, but is not limited to, cauda equina, lumbar radiculopathy, spinal stenosis, arterial insufficiency, DVT, muscle strain.  Patient nontoxic-appearing and in no acute distress, vital signs are unremarkable.  He is neurovascular intact to his right lower extremity with strong DP pulse and good strength, ambulates here in the ED without difficulty.  Pain to his posterior thigh along with occasional weakness seems most consistent with lumbar radiculopathy versus other spinal process but no findings concerning for cauda equina at this time.  Do not feel emergent MRI is indicated but patient would benefit from outpatient follow-up with neurosurgery.  He was counseled to return to the ED for new or worsening symptoms, patient agrees with plan.      FINAL CLINICAL IMPRESSION(S) / ED DIAGNOSES   Final diagnoses:   Right leg pain  Lumbar radiculopathy     Rx / DC Orders   ED Discharge Orders     None        Note:  This document was prepared using Dragon voice recognition software and may include unintentional dictation errors.   Chesley Noon, MD 05/29/23 [phone]

## 2023-05-29 NOTE — ED Triage Notes (Signed)
Patient to ED via POV for intermittent right upper thigh pain. Patient states pain started around 4am. Denies injury or swelling. Ambulatory without difficulty. No pain currently.

## 2023-05-31 NOTE — Progress Notes (Unsigned)
Referring Physician:  Jerl Mina, MD [address] [place],  [place] [address]  Primary Physician:  Lucas Mina, MD  History of Present Illness: 05/31/2023*** Lucas Matthews has a history of HTN.   Seen in ED on 05/29/23 for right posterior thigh pain.     Duration: *** Location: *** Quality: *** Severity: ***  Precipitating: aggravated by *** Modifying factors: made better by *** Weakness: none Timing: *** Bowel/Bladder Dysfunction: none  Conservative measures:  Physical therapy: ***  Multimodal medical therapy including regular antiinflammatories: voltaren gel, naprosyn, ultram  Injections: *** epidural steroid injections  Past Surgery: ***  Lucas Matthews has ***no symptoms of cervical myelopathy.  The symptoms are causing a significant impact on the patient's life.   Review of Systems:  A 10 point review of systems is negative, except for the pertinent positives and negatives detailed in the HPI.  Past Medical History: Past Medical History:  Diagnosis Date   Hypertension     Past Surgical History: Past Surgical History:  Procedure Laterality Date   COLONOSCOPY  2009   Done in [place], [place]    Allergies: Allergies as of 06/05/2023   (No Known Allergies)    Medications: Outpatient Encounter Medications as of 06/05/2023  Medication Sig   albuterol (VENTOLIN HFA) 108 (90 Base) MCG/ACT inhaler Inhale 2 puffs into the lungs every 6 (six) hours as needed for wheezing or shortness of breath.   alfuzosin (UROXATRAL) 10 MG 24 hr tablet Take 10 mg by mouth daily with breakfast.   cetirizine-pseudoephedrine (ZYRTEC-D) 5-120 MG tablet Take 1 tablet by mouth 2 (two) times daily as needed for allergies or rhinitis.   diclofenac sodium (VOLTAREN) 1 % GEL Apply 1 application topically 4 (four) times daily.   hydrochlorothiazide (HYDRODIURIL) 25 MG tablet Take 25 mg by mouth daily.   naproxen (NAPROSYN) 375 MG tablet Take 1  tablet (375 mg total) by mouth 2 (two) times daily. Prn muscle pain. Take with food.   pantoprazole (PROTONIX) 40 MG tablet Take 1 tablet (40 mg total) by mouth daily.   traMADol (ULTRAM) 50 MG tablet Take 1 tablet (50 mg total) by mouth every 6 (six) hours as needed.   No facility-administered encounter medications on file as of 06/05/2023.    Social History: Social History   Tobacco Use   Smoking status: Never   Smokeless tobacco: Never  Substance Use Topics   Alcohol use: Yes    Comment: rare   Drug use: No    Family Medical History: No family history on file.  Physical Examination: There were no vitals filed for this visit.  General: Patient is well developed, well nourished, calm, collected, and in no apparent distress. Attention to examination is appropriate.  Respiratory: Patient is breathing without any difficulty.   NEUROLOGICAL:     Awake, alert, oriented to person, place, and time.  Speech is clear and fluent. Fund of knowledge is appropriate.   Cranial Nerves: Pupils equal round and reactive to light.  Facial tone is symmetric.    *** ROM of cervical spine *** pain *** posterior cervical tenderness. *** tenderness in bilateral trapezial region.   *** ROM of lumbar spine *** pain *** posterior lumbar tenderness.   No abnormal lesions on exposed skin.   Strength: Side Biceps Triceps Deltoid Interossei Grip Wrist Ext. Wrist Flex.  R 5 5 5 5 5 5 5   L 5 5 5 5 5 5 5    Side Iliopsoas Quads Hamstring  PF DF EHL  R 5 5 5 5 5 5   L 5 5 5 5 5 5    Reflexes are ***2+ and symmetric at the biceps, brachioradialis, patella and achilles.   Hoffman's is absent.  Clonus is not present.   Bilateral upper and lower extremity sensation is intact to light touch.     Gait is normal.   ***No difficulty with tandem gait.    Medical Decision Making  Imaging: No lumbar imaging.   I have personally reviewed the images and agree with the above interpretation.  Assessment  and Plan: [name] is a pleasant 77 y.o. male has ***  Treatment options discussed with patient and following plan made:   - Order for physical therapy for *** spine ***. Patient to call to schedule appointment. *** - Continue current medications including ***. Reviewed dosing and side effects.  - Prescription for ***. Reviewed dosing and side effects. Take with food.  - Prescription for *** to take prn muscle spasms. Reviewed dosing and side effects. Discussed this can cause drowsiness.  - MRI of *** to further evaluate *** radiculopathy. No improvement time or medications (***).  - Referral to PMR at [facility] to discuss possible *** injections.  - Will schedule phone visit to review MRI results once I get them back.   I spent a total of *** minutes in face-to-face and non-face-to-face activities related to this patient's care today including review of outside records, review of imaging, review of symptoms, physical exam, discussion of differential diagnosis, discussion of treatment options, and documentation.   Thank you for involving me in the care of this patient.   Lucas Leach PA-C Dept. of Neurosurgery

## 2023-06-05 ENCOUNTER — Encounter: Payer: Self-pay | Admitting: Orthopedic Surgery

## 2023-06-05 ENCOUNTER — Ambulatory Visit (INDEPENDENT_AMBULATORY_CARE_PROVIDER_SITE_OTHER): Payer: Medicare Other | Admitting: Orthopedic Surgery

## 2023-06-05 VITALS — BP 120/68 | Ht 67.0 in | Wt 156.4 lb

## 2023-06-05 DIAGNOSIS — M79604 Pain in right leg: Secondary | ICD-10-CM | POA: Diagnosis not present

## 2023-06-05 NOTE — Patient Instructions (Signed)
It was so nice to see you today. Thank you so much for coming in.    The pain in your right leg may be coming from your back.   If the pain starts coming more often or comes and lasts then I want you to let me know and we can order an MRI of your lower back.   If you are having the pain in the right leg, do not get on a ladder at work.   I will message you in 3 weeks to check on you, but please do not hesitate to call if you have any questions or concerns. You can also message me in MyChart.    Drake Leach PA-C [phone]

## 2023-07-06 NOTE — Progress Notes (Unsigned)
Referring Physician:  Jerl Mina, MD [address] [place],  [place] [address]  Primary Physician:  Jerl Mina, MD  History of Present Illness: Lucas Matthews has a history of HTN.   Last seen by me on 06/05/23 for right thigh pain and weakness. He was better when he saw me. We discussed lumbar MRI or EMG and he declined.   He is here for follow up.   He has intermittent stiffness and pain in his lower back. This is worse after prolonged sitting and in the morning. He continues with intermittent right lateral thigh pain that 30 seconds or so with weakness. He has this a few times a week and it is getting more frequent. No left leg pain.   History of back pain in the past- he had ESIs 6-7 years ago.   Bowel/Bladder Dysfunction: none  Conservative measures:  Physical therapy: none Multimodal medical therapy including regular antiinflammatories: voltaren gel, naprosyn, ultram  Injections: Had lumbar ESI 6-7 years ago  Past Surgery: No spinal surgery  Jamarr Treinen has no symptoms of cervical myelopathy.  The symptoms are causing a significant impact on the patient's life.   Review of Systems:  A 10 point review of systems is negative, except for the pertinent positives and negatives detailed in the HPI.  Past Medical History: Past Medical History:  Diagnosis Date   Hypertension     Past Surgical History: Past Surgical History:  Procedure Laterality Date   COLONOSCOPY  2009   Done in [place], [place]    Allergies: Allergies as of 07/11/2023 - Review Complete 06/05/2023  Allergen Reaction Noted   Fluticasone Other (See Comments) 03/01/2021   Ibuprofen Other (See Comments) 10/11/2018   Loratadine Other (See Comments) 03/01/2021    Medications: Outpatient Encounter Medications as of 07/11/2023  Medication Sig   albuterol (VENTOLIN HFA) 108 (90 Base) MCG/ACT inhaler Inhale 2 puffs into the lungs every 6 (six) hours as  needed for wheezing or shortness of breath.   alfuzosin (UROXATRAL) 10 MG 24 hr tablet Take 10 mg by mouth daily with breakfast.   diclofenac sodium (VOLTAREN) 1 % GEL Apply 1 application topically 4 (four) times daily.   hydrochlorothiazide (HYDRODIURIL) 25 MG tablet Take 25 mg by mouth daily.   naproxen (NAPROSYN) 375 MG tablet Take 1 tablet (375 mg total) by mouth 2 (two) times daily. Prn muscle pain. Take with food.   No facility-administered encounter medications on file as of 07/11/2023.    Social History: Social History   Tobacco Use   Smoking status: Never   Smokeless tobacco: Never  Substance Use Topics   Alcohol use: Yes    Comment: rare   Drug use: No    Family Medical History: No family history on file.  Physical Examination: There were no vitals filed for this visit.    Awake, alert, oriented to person, place, and time.  Speech is clear and fluent. Fund of knowledge is appropriate.   Cranial Nerves: Pupils equal round and reactive to light.  Facial tone is symmetric.    No lower lumbar tenderness.   No pain with IR/ER of both hips.   No abnormal lesions on exposed skin.   Strength:  Side Iliopsoas Quads Hamstring PF DF EHL  R 5 5 5 5 5 5   L 5 5 5 5 5 5    Reflexes are 2+ and symmetric at the patella and achilles.    Clonus is not present.  Bilateral lower extremity sensation is intact to light touch.     Gait is normal.     Medical Decision Making  Imaging: No lumbar imaging.   Assessment and Plan: He has intermittent stiffness and pain in his lower back. This is worse after prolonged sitting and in the morning. He continues with intermittent right lateral thigh pain that 30 seconds or so with weakness. He has this a few times a week and it is getting more frequent. No left leg pain.   Right thigh and LBP are likely lumbar mediated. No pain with IR/ER of his hips.   Treatment options discussed with patient and following plan made:   - MRI of  lumbar spine to further evaluate right LE radiculopathy. No improvement with time or medications (voltaren gel, naproxen).  - Could consider EMG if symptoms persist.  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  - Will schedule follow up with me once I have his MRI results back.   I spent a total of 15 minutes in face-to-face and non-face-to-face activities related to this patient's care today including review of outside records, review of imaging, review of symptoms, physical exam, discussion of differential diagnosis, discussion of treatment options, and documentation.   Drake Leach PA-C Dept. of Neurosurgery

## 2023-07-11 ENCOUNTER — Ambulatory Visit: Payer: Medicare Other | Admitting: Orthopedic Surgery

## 2023-07-11 ENCOUNTER — Encounter: Payer: Self-pay | Admitting: Orthopedic Surgery

## 2023-07-11 VITALS — BP 112/70 | Ht 67.0 in | Wt 156.0 lb

## 2023-07-11 DIAGNOSIS — G8929 Other chronic pain: Secondary | ICD-10-CM

## 2023-07-11 DIAGNOSIS — M5416 Radiculopathy, lumbar region: Secondary | ICD-10-CM | POA: Diagnosis not present

## 2023-07-11 DIAGNOSIS — M545 Low back pain, unspecified: Secondary | ICD-10-CM | POA: Diagnosis not present

## 2023-07-11 NOTE — Patient Instructions (Signed)
It was so nice to see you today. Thank you so much for coming in.    I want to get an MRI of your lower back to look into things further. We will get this approved through your insurance and [facility] will call you to schedule the appointment.    Outpatient Imaging (building with the white pillars) is located off of [address]. The address is [address] [place], [place] [address].    After you have the MRI, it takes 10-14 days for me to get the results back. Once I have them, we will call you to schedule a follow up visit with me to review them.   If you are having the pain in your leg, you should avoid ladders at work.   Please do not hesitate to call if you have any questions or concerns. You can also message me in MyChart.   Drake Leach PA-C [phone]     The physicians and staff at [facility] at [facility] are committed to providing excellent care. You may receive a survey asking for feedback about your experience at our office. We value you your feedback and appreciate you taking the time to to fill it out. The [facility] leadership team is also available to discuss your experience in person, feel free to contact us [phone].

## 2023-07-15 ENCOUNTER — Ambulatory Visit: Payer: Medicare Other

## 2023-07-15 ENCOUNTER — Ambulatory Visit
Admission: RE | Admit: 2023-07-15 | Discharge: 2023-07-15 | Disposition: A | Discharge status: Home / Self Care | Payer: Medicare Other | Source: Ambulatory Visit | Attending: Orthopedic Surgery | Admitting: Orthopedic Surgery

## 2023-07-15 DIAGNOSIS — Z23 Encounter for immunization: Secondary | ICD-10-CM

## 2023-07-15 DIAGNOSIS — M5441 Lumbago with sciatica, right side: Secondary | ICD-10-CM | POA: Diagnosis present

## 2023-07-15 DIAGNOSIS — Z719 Counseling, unspecified: Secondary | ICD-10-CM

## 2023-07-15 DIAGNOSIS — M5416 Radiculopathy, lumbar region: Secondary | ICD-10-CM | POA: Insufficient documentation

## 2023-07-15 DIAGNOSIS — G8929 Other chronic pain: Secondary | ICD-10-CM | POA: Diagnosis present

## 2023-07-15 NOTE — Progress Notes (Signed)
Patient seen in nurse clinic for Moderna COVID vaccine. Spikevax +12Y 2024-25 IM left deltoid. Tolerated well. [facility] updated and copy provided. Waited 10 minutes.

## 2023-08-29 NOTE — Progress Notes (Signed)
Referring Physician:  No referring provider defined for this encounter.  Primary Physician:  Jerl Mina, MD  History of Present Illness: [name] has a history of HTN.   Last seen by me on 07/11/23 for intermittent stiffness and pain in his lower back. This is worse after prolonged sitting and in the morning. He continues with intermittent right lateral thigh pain that 30 seconds or so with weakness.   He was to stop using ladder at work and he is here to review his lumbar MRI scan.   He has been doing better. He still has some intermittent stiffness and pain in his lower back. He has been out of work since 08/08/23 and this has helped. He wanted to stay out until we reviewed his lumbar MRI. He also still has the intermittent right lateral thigh pain that lasts 30 seconds or so with weakness. He's only had this a few times since his last visit.  History of back pain in the past- he had ESIs 6-7 years ago.   Bowel/Bladder Dysfunction: none  Conservative measures:  Physical therapy: none Multimodal medical therapy including regular antiinflammatories: voltaren gel, naprosyn, ultram  Injections: Had lumbar ESI 6-7 years ago  Past Surgery: No spinal surgery  Lucas Matthews has no symptoms of cervical myelopathy.  The symptoms are causing a significant impact on the patient's life.   Review of Systems:  A 10 point review of systems is negative, except for the pertinent positives and negatives detailed in the HPI.  Past Medical History: Past Medical History:  Diagnosis Date   Hypertension     Past Surgical History: Past Surgical History:  Procedure Laterality Date   COLONOSCOPY  2009   Done in [place], [place]    Allergies: Allergies as of 09/02/2023 - Review Complete 07/15/2023  Allergen Reaction Noted   Fluticasone Other (See Comments) 03/01/2021   Ibuprofen Other (See Comments) 10/11/2018   Loratadine Other (See Comments) 03/01/2021     Medications: Outpatient Encounter Medications as of 09/02/2023  Medication Sig   albuterol (VENTOLIN HFA) 108 (90 Base) MCG/ACT inhaler Inhale 2 puffs into the lungs every 6 (six) hours as needed for wheezing or shortness of breath.   alfuzosin (UROXATRAL) 10 MG 24 hr tablet Take 10 mg by mouth daily with breakfast.   Cholecalciferol 50 MCG (2000 UT) TABS Take 1 tablet by mouth daily.   diclofenac sodium (VOLTAREN) 1 % GEL Apply 1 application topically 4 (four) times daily.   fluticasone (FLONASE) 50 MCG/ACT nasal spray Place 2 sprays into both nostrils daily.   hydrochlorothiazide (HYDRODIURIL) 25 MG tablet Take 25 mg by mouth daily.   ketoconazole (NIZORAL) 2 % cream Apply 1 Application topically daily.   naproxen (NAPROSYN) 375 MG tablet Take 1 tablet (375 mg total) by mouth 2 (two) times daily. Prn muscle pain. Take with food.   sildenafil (VIAGRA) 100 MG tablet Take 50 mg by mouth as needed.   No facility-administered encounter medications on file as of 09/02/2023.    Social History: Social History   Tobacco Use   Smoking status: Never   Smokeless tobacco: Never  Substance Use Topics   Alcohol use: Yes    Comment: rare   Drug use: No    Family Medical History: No family history on file.  Physical Examination: There were no vitals filed for this visit.    Awake, alert, oriented to person, place, and time.  Speech is clear and fluent. Fund of knowledge is appropriate.  Cranial Nerves: Pupils equal round and reactive to light.  Facial tone is symmetric.    No lower lumbar tenderness.   No pain with IR/ER of both hips.   No abnormal lesions on exposed skin.   Strength:  Side Iliopsoas Quads Hamstring PF DF EHL  R 5 5 5 5 5 5   L 5 5 5 5 5 5    Reflexes are 2+ and symmetric at the patella and achilles.    Clonus is not present.   Bilateral lower extremity sensation is intact to light touch.     Gait is normal.     Medical Decision Making  Imaging: MRI of  lumbar spine dated 07/15/23:  FINDINGS: Segmentation:  Standard.   Alignment: Mild degenerative anterolisthesis at L4-5. slight dextrocurvature.   Vertebrae:  No fracture, evidence of discitis, or bone lesion.   Conus medullaris and cauda equina: Conus extends to the T12-L1 level. Conus and cauda equina appear normal.   Paraspinal and other soft tissues: No perispinal mass or inflammation noted. Prominent, smooth bladder wall thickness, partially covered, usually related to chronic outlet obstruction in this demographic.   Disc levels:   L1-L2: Disc narrowing and bulging with mild endplate and facet spurring. The canal and foramina are patent   L2-L3: Disc narrowing and bulging. Degenerative facet spurring on both sides. Mild triangular narrowing of the thecal sac. Patent foramina   L3-L4: Degenerative facet spurring with ligamentum flavum thickening. Mild circumferential disc bulging. Mild thecal sac stenosis   L4-L5: Degenerative facet spurring with anterolisthesis. Circumferential disc bulging. Mild to moderate narrowing of the thecal sac crowding the right more than left subarticular recess. The foramina are patent   L5-S1:Degenerative endplate spurring eccentric to the right. No neural compression.   IMPRESSION: 1. Generalized lumbar spine degeneration especially affecting facets with L4-5 anterolisthesis. 2. Noncompressive spinal canal narrowing at L2-3 to L4-5. Diffusely patent foramina. 3. Notable subarticular recess narrowing on the right at L4-5, potentially affecting the right L5 nerve root.     Electronically Signed   By: Tiburcio Pea M.D.   On: 08/09/2023 05:13   I have personally reviewed the images and agree with the above interpretation.   Assessment and Plan: He still has some intermittent stiffness and pain in his lower back. He has been out of work since 08/08/23 and this has helped. He also still has the intermittent right lateral thigh  pain that lasts 30 seconds or so with weakness. He's only had this a few times since his last visit. Overall, he is doing better.   He has known lumbar spondylosis with mild central stenosis L2-L4. Slip at L4-L5 with moderate central stenosis and bilateral lateral recess stenosis.   LBP/stiffness is likely from spondylosis. Not sure that right thigh pain is lumbar mediated.   MRI also showed bladder changes suspicious for chronic outlet obstruction.   Treatment options discussed with patient and following plan made:   - He is in PT at the [place] for his back. He will continue this. Given copy of MRI report to share with them.  - If no improvement with PT, may consider referral for possible facet injections. May also consider EMG if right leg symptoms persist.  - Discussed possible chronic outlet obstruction of bladder seen on MRI. He will follow up with urologist at [facility] about this.  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 He can return to work 09/03/23 but cannot use a ladder. Restrictions pending his  follow up.  - Follow up with me in 6-8 weeks and prn.   I spent a total of 20 minutes in face-to-face and non-face-to-face activities related to this patient's care today including review of outside records, review of imaging, review of symptoms, physical exam, discussion of differential diagnosis, discussion of treatment options, and documentation.   Drake Leach PA-C Dept. of Neurosurgery

## 2023-09-02 ENCOUNTER — Ambulatory Visit: Payer: Medicare Other | Admitting: Orthopedic Surgery

## 2023-09-02 ENCOUNTER — Encounter: Payer: Self-pay | Admitting: Orthopedic Surgery

## 2023-09-02 VITALS — BP 108/64 | Ht 67.0 in | Wt 156.0 lb

## 2023-09-02 DIAGNOSIS — M4726 Other spondylosis with radiculopathy, lumbar region: Secondary | ICD-10-CM | POA: Diagnosis not present

## 2023-09-02 DIAGNOSIS — M47816 Spondylosis without myelopathy or radiculopathy, lumbar region: Secondary | ICD-10-CM

## 2023-09-02 DIAGNOSIS — M5416 Radiculopathy, lumbar region: Secondary | ICD-10-CM

## 2023-09-02 DIAGNOSIS — M48061 Spinal stenosis, lumbar region without neurogenic claudication: Secondary | ICD-10-CM | POA: Diagnosis not present

## 2023-09-02 NOTE — Patient Instructions (Signed)
It was so nice to see you today. Thank you so much for coming in.    You  have some wear and tear in your back (arthritis) along with some spinal stenosis. I think the stiffness in the back is from arthritis.   Your MRI also showed some changes in your bladder that may be related to chronic outlet obstruction. I would follow up with your urologist at the [facility] about this.   I would follow up with your PCP about the pain in your hands.   Continue with PT at the [facility] for your back. You can show them the MRI report.   If no improvement, we can consider injections.   I gave you a note for work. No climbing ladders.   I will see you back in 6-8 weeks.   Please do not hesitate to call if you have any questions or concerns. You can also message me in MyChart.   Drake Leach PA-C [phone]     The physicians and staff at [facility] at [facility] are committed to providing excellent care. You may receive a survey asking for feedback about your experience at our office. We value you your feedback and appreciate you taking the time to to fill it out. The [facility] leadership team is also available to discuss your experience in person, feel free to contact us [phone].

## 2023-10-18 NOTE — Progress Notes (Deleted)
 Referring Physician:  Jerl Mina, MD [address] [place],  [place] [address]  Primary Physician:  Jerl Mina, MD  History of Present Illness: [name] has a history of HTN.   Last seen by me on 09/02/23 for intermittent stiffness and pain in his lower back and intermittent right lateral thigh pain.   He has known lumbar spondylosis with mild central stenosis L2-L4. Slip at L4-L5 with moderate central stenosis and bilateral lateral recess stenosis.    LBP/stiffness is likely from spondylosis. Not sure that right thigh pain is lumbar mediated.    MRI also showed bladder changes suspicious for chronic outlet obstruction.   He was to continue with PT at the [facility] and f/u with urology about possible chronic outlet obstruction. Given work note that he cannot use ladder.   He is here for follow up.   He still has some intermittent stiffness and pain in his lower back. He has been out of work since 08/08/23 and this has helped. He also still has the intermittent right lateral thigh pain that lasts 30 seconds or so with weakness. He's only had this a few times since his last visit. Overall, he is doing better.        He was to stop using ladder at work and he is here to review his lumbar MRI scan.   He has been doing better. He still has some intermittent stiffness and pain in his lower back. He has been out of work since 08/08/23 and this has helped. He wanted to stay out until we reviewed his lumbar MRI. He also still has the intermittent right lateral thigh pain that lasts 30 seconds or so with weakness. He's only had this a few times since his last visit.  History of back pain in the past- he had ESIs 6-7 years ago.   Bowel/Bladder Dysfunction: none  Conservative measures:  Physical therapy: in PT at [facility] for his back Multimodal medical therapy including regular antiinflammatories: voltaren gel, naprosyn, ultram  Injections: Had lumbar ESI 6-7  years ago  Past Surgery: No spinal surgery  Lucas Matthews has no symptoms of cervical myelopathy.  The symptoms are causing a significant impact on the patient's life.   Review of Systems:  A 10 point review of systems is negative, except for the pertinent positives and negatives detailed in the HPI.  Past Medical History: Past Medical History:  Diagnosis Date   Hypertension     Past Surgical History: Past Surgical History:  Procedure Laterality Date   COLONOSCOPY  2009   Done in [place], [place]    Allergies: Allergies as of 10/21/2023 - Review Complete 09/02/2023  Allergen Reaction Noted   Fluticasone Other (See Comments) 03/01/2021   Ibuprofen Other (See Comments) 10/11/2018   Loratadine Other (See Comments) 03/01/2021    Medications: Outpatient Encounter Medications as of 10/21/2023  Medication Sig   albuterol (VENTOLIN HFA) 108 (90 Base) MCG/ACT inhaler Inhale 2 puffs into the lungs every 6 (six) hours as needed for wheezing or shortness of breath.   alfuzosin (UROXATRAL) 10 MG 24 hr tablet Take 10 mg by mouth daily with breakfast.   budesonide (PULMICORT) 0.5 MG/2ML nebulizer solution USE 1 VIAL VIA NEBULIZER EVERY MORNING AMD EVERY NIGHT AT BEDTIME   Cholecalciferol 50 MCG (2000 UT) TABS Take 1 tablet by mouth daily.   diclofenac sodium (VOLTAREN) 1 % GEL Apply 1 application topically 4 (four) times daily.   fluticasone (FLONASE) 50 MCG/ACT  nasal spray Place 2 sprays into both nostrils daily.   hydrochlorothiazide (HYDRODIURIL) 25 MG tablet Take 25 mg by mouth daily.   ketoconazole (NIZORAL) 2 % cream Apply 1 Application topically daily.   naproxen (NAPROSYN) 375 MG tablet Take 1 tablet (375 mg total) by mouth 2 (two) times daily. Prn muscle pain. Take with food.   sildenafil (VIAGRA) 100 MG tablet Take 50 mg by mouth as needed.   No facility-administered encounter medications on file as of 10/21/2023.    Social History: Social History   Tobacco Use    Smoking status: Never   Smokeless tobacco: Never  Substance Use Topics   Alcohol use: Yes    Comment: rare   Drug use: No    Family Medical History: No family history on file.  Physical Examination: There were no vitals filed for this visit.    Awake, alert, oriented to person, place, and time.  Speech is clear and fluent. Fund of knowledge is appropriate.   Cranial Nerves: Pupils equal round and reactive to light.  Facial tone is symmetric.    No lower lumbar tenderness.   No pain with IR/ER of both hips.   No abnormal lesions on exposed skin.   Strength:  Side Iliopsoas Quads Hamstring PF DF EHL  R 5 5 5 5 5 5   L 5 5 5 5 5 5    Reflexes are 2+ and symmetric at the patella and achilles.    Clonus is not present.   Bilateral lower extremity sensation is intact to light touch.     Gait is normal.     Medical Decision Making  Imaging: none   Assessment and Plan: He still has some intermittent stiffness and pain in his lower back. He has been out of work since 08/08/23 and this has helped. He also still has the intermittent right lateral thigh pain that lasts 30 seconds or so with weakness. He's only had this a few times since his last visit. Overall, he is doing better.   He has known lumbar spondylosis with mild central stenosis L2-L4. Slip at L4-L5 with moderate central stenosis and bilateral lateral recess stenosis.   LBP/stiffness is likely from spondylosis. Not sure that right thigh pain is lumbar mediated.   MRI also showed bladder changes suspicious for chronic outlet obstruction.   Treatment options discussed with patient and following plan made:   - He is in PT at the [place] for his back. He will continue this. Given copy of MRI report to share with them.  - If no improvement with PT, may consider referral for possible facet injections. May also consider EMG if right leg symptoms persist.  - Discussed possible chronic outlet obstruction of bladder seen on  MRI. He will follow up with urologist at [facility] about this.  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 He can return to work 09/03/23 but cannot use a ladder. Restrictions pending his follow up.  - Follow up with me in 6-8 weeks and prn.   I spent a total of 20 minutes in face-to-face and non-face-to-face activities related to this patient's care today including review of outside records, review of imaging, review of symptoms, physical exam, discussion of differential diagnosis, discussion of treatment options, and documentation.   Drake Leach PA-C Dept. of Neurosurgery

## 2023-10-21 ENCOUNTER — Ambulatory Visit: Payer: Medicare Other | Admitting: Orthopedic Surgery

## 2023-10-27 NOTE — Progress Notes (Signed)
 Referring Physician:  No referring provider defined for this encounter.  Primary Physician:  Valora Agent, MD  History of Present Illness: [name] has a history of HTN.   Last seen by me on 09/02/23 for intermittent stiffness and pain in his lower back and intermittent right lateral thigh pain.   He has known lumbar spondylosis with mild central stenosis L2-L4. Slip at L4-L5 with moderate central stenosis and bilateral lateral recess stenosis.    LBP/stiffness is likely from spondylosis. Not sure that right thigh pain is lumbar mediated.    MRI also showed bladder changes suspicious for chronic outlet obstruction.   He was to continue with PT at the [facility] and f/u with urology about possible chronic outlet obstruction. Given work note that he cannot use ladder.   He is here for follow up.   He is doing much better with PT at the [facility]. He has no pain in his back or legs. He has not had the pain in his right lateral thigh since December. He is taking a leave of absence from work. No numbness, tingling, or weakness.   History of back pain in the past- he had ESIs 6-7 years ago.   Conservative measures:  Physical therapy: in PT at [facility] for his back Multimodal medical therapy including regular antiinflammatories: voltaren  gel, naprosyn , ultram   Injections: Had lumbar ESI 6-7 years ago  Past Surgery: No spinal surgery  Lucas Matthews has no symptoms of cervical myelopathy.  The symptoms are causing a significant impact on the patient's life.   Review of Systems:  A 10 point review of systems is negative, except for the pertinent positives and negatives detailed in the HPI.  Past Medical History: Past Medical History:  Diagnosis Date   Hypertension     Past Surgical History: Past Surgical History:  Procedure Laterality Date   COLONOSCOPY  2009   Done in [place], [place]    Allergies: Allergies as of 10/29/2023 - Review Complete 10/29/2023  Allergen  Reaction Noted   Fluticasone Other (See Comments) 03/01/2021   Ibuprofen Other (See Comments) 10/11/2018   Loratadine Other (See Comments) 03/01/2021    Medications: Outpatient Encounter Medications as of 10/29/2023  Medication Sig   albuterol  (VENTOLIN  HFA) 108 (90 Base) MCG/ACT inhaler Inhale 2 puffs into the lungs every 6 (six) hours as needed for wheezing or shortness of breath.   alfuzosin (UROXATRAL) 10 MG 24 hr tablet Take 10 mg by mouth daily with breakfast.   budesonide (PULMICORT) 0.5 MG/2ML nebulizer solution USE 1 VIAL VIA NEBULIZER EVERY MORNING AMD EVERY NIGHT AT BEDTIME   Cholecalciferol 50 MCG (2000 UT) TABS Take 1 tablet by mouth daily.   diclofenac  sodium (VOLTAREN ) 1 % GEL Apply 1 application topically 4 (four) times daily.   fluticasone (FLONASE) 50 MCG/ACT nasal spray Place 2 sprays into both nostrils daily.   hydrochlorothiazide (HYDRODIURIL) 25 MG tablet Take 25 mg by mouth daily.   ketoconazole (NIZORAL) 2 % cream Apply 1 Application topically daily.   sildenafil (VIAGRA) 100 MG tablet Take 50 mg by mouth as needed.   [DISCONTINUED] naproxen  (NAPROSYN ) 375 MG tablet Take 1 tablet (375 mg total) by mouth 2 (two) times daily. Prn muscle pain. Take with food.   No facility-administered encounter medications on file as of 10/29/2023.    Social History: Social History   Tobacco Use   Smoking status: Never   Smokeless tobacco: Never  Substance Use Topics   Alcohol use: Yes  Comment: rare   Drug use: No    Family Medical History: History reviewed. No pertinent family history.  Physical Examination: Vitals:   10/29/23 0837  BP: 116/72      Awake, alert, oriented to person, place, and time.  Speech is clear and fluent. Fund of knowledge is appropriate.   Cranial Nerves: Pupils equal round and reactive to light.  Facial tone is symmetric.    No abnormal lesions on exposed skin.   Strength:  Side Iliopsoas Quads Hamstring PF DF EHL  R 5 5 5 5 5 5   L 5  5 5 5 5 5    Reflexes are 2+ and symmetric at the patella and achilles.    Clonus is not present.   Bilateral lower extremity sensation is intact to light touch.     Gait is normal.     Medical Decision Making  Imaging: none   Assessment and Plan: He is doing much better with no current back or leg pain. He is in PT at the [place]. He's not had the intermittent right lateral thigh pain since December.   He has known lumbar spondylosis with mild central stenosis L2-L4. Slip at L4-L5 with moderate central stenosis and bilateral lateral recess stenosis.   Treatment options discussed with patient and following plan made:   - He is in PT at the [facility] for his back. He will continue with HEP once he is done.  - He is on leave of absence from work. Okay to return from my standpoint when/if he is ready.  - He will f/u with me prn.   I spent a total of 10 minutes in face-to-face and non-face-to-face activities related to this patient's care today including review of outside records, review of imaging, review of symptoms, physical exam, discussion of differential diagnosis, discussion of treatment options, and documentation.   Glade Boys PA-C Dept. of Neurosurgery

## 2023-10-29 ENCOUNTER — Encounter: Payer: Self-pay | Admitting: Orthopedic Surgery

## 2023-10-29 ENCOUNTER — Ambulatory Visit: Payer: Medicare Other | Admitting: Orthopedic Surgery

## 2023-10-29 VITALS — BP 116/72 | Ht 67.0 in | Wt 156.0 lb

## 2023-10-29 DIAGNOSIS — M48061 Spinal stenosis, lumbar region without neurogenic claudication: Secondary | ICD-10-CM

## 2023-10-29 DIAGNOSIS — M4726 Other spondylosis with radiculopathy, lumbar region: Secondary | ICD-10-CM

## 2023-10-29 DIAGNOSIS — M47816 Spondylosis without myelopathy or radiculopathy, lumbar region: Secondary | ICD-10-CM

## 2023-10-29 DIAGNOSIS — M5416 Radiculopathy, lumbar region: Secondary | ICD-10-CM

## 2024-05-01 NOTE — Progress Notes (Signed)
 Referring Physician:  Valora Agent, MD [address] [place],  [place] [address]  Primary Physician:  Valora Agent, MD  History of Present Illness: Mr. Hilton Saephan has a history of HTN.   Last seen by me on 10/29/23 for intermittent stiffness and pain in his lower back and intermittent right lateral thigh pain.   He has known lumbar spondylosis with mild central stenosis L2-L4. Slip at L4-L5 with moderate central stenosis and bilateral lateral recess stenosis.   His pain improved with PT and he was released to follow up prn.   He is here today with complaints of intermittent numbness/tingling in left hand x 2 years. He has it about every other day. No pain, but it can be uncomfortable. No weakness. He's had occasional numbness in right hand.   Conservative measures:  Physical therapy: in PT at [facility] for his back Multimodal medical therapy including regular antiinflammatories: voltaren  gel, naprosyn , ultram   Injections: Had lumbar ESI 6-7 years ago  Past Surgery: No spinal surgery  Alok Minshall has no symptoms of cervical myelopathy.  The symptoms are causing a significant impact on the patient's life.   Review of Systems:  A 10 point review of systems is negative, except for the pertinent positives and negatives detailed in the HPI.  Past Medical History: Past Medical History:  Diagnosis Date   Hypertension     Past Surgical History: Past Surgical History:  Procedure Laterality Date   COLONOSCOPY  2009   Done in [place], [place]    Allergies: Allergies as of 05/12/2024 - Review Complete 05/12/2024  Allergen Reaction Noted   Fluticasone Other (See Comments) 03/01/2021   Ibuprofen Other (See Comments) 10/11/2018   Loratadine Other (See Comments) 03/01/2021    Medications: Outpatient Encounter Medications as of 05/12/2024  Medication Sig   albuterol  (VENTOLIN  HFA) 108 (90 Base) MCG/ACT inhaler Inhale 2 puffs into the lungs every  6 (six) hours as needed for wheezing or shortness of breath.   alfuzosin (UROXATRAL) 10 MG 24 hr tablet Take 10 mg by mouth daily with breakfast.   budesonide (PULMICORT) 0.5 MG/2ML nebulizer solution USE 1 VIAL VIA NEBULIZER EVERY MORNING AMD EVERY NIGHT AT BEDTIME   Cholecalciferol 50 MCG (2000 UT) TABS Take 1 tablet by mouth daily.   diclofenac  sodium (VOLTAREN ) 1 % GEL Apply 1 application topically 4 (four) times daily.   fluticasone (FLONASE) 50 MCG/ACT nasal spray Place 2 sprays into both nostrils daily.   fluticasone-salmeterol (ADVAIR) 250-50 MCG/ACT AEPB Inhale 1 puff into the lungs.   hydrochlorothiazide (HYDRODIURIL) 25 MG tablet Take 25 mg by mouth daily.   ketoconazole (NIZORAL) 2 % cream Apply 1 Application topically daily.   ketotifen (ZADITOR) 0.035 % ophthalmic solution Apply to eye.   sildenafil (VIAGRA) 100 MG tablet Take 50 mg by mouth as needed.   terbinafine (LAMISIL) 250 MG tablet Take 250 mg by mouth daily.   No facility-administered encounter medications on file as of 05/12/2024.    Social History: Social History   Tobacco Use   Smoking status: Never   Smokeless tobacco: Never  Substance Use Topics   Alcohol use: Yes    Comment: rare   Drug use: No    Family Medical History: History reviewed. No pertinent family history.  Physical Examination: Vitals:   05/12/24 0952  BP: 104/66    Awake, alert, oriented to person, place, and time.  Speech is clear and fluent. Fund of knowledge is appropriate.   Cranial Nerves:  Pupils equal round and reactive to light.  Facial tone is symmetric.    No abnormal lesions on exposed skin.   He has no tenderness at [facility] joint on left. No pain with grinding. He has no tenderness over first dorsal compartment.   Negative tinels at wrist and elbow bilaterally. Negative phalens at wrist bilaterally.   Strength: Side Biceps Triceps Deltoid Interossei Grip Wrist Ext. Wrist Flex.  R 5 5 5 5 5 5 5   L 5 5 5 5 5 5 5    Side  Iliopsoas Quads Hamstring PF DF EHL  R 5 5 5 5 5 5   L 5 5 5 5 5 5    Reflexes are 2+ and symmetric at the biceps, brachioradialis, patella and achilles.   Hoffman's is absent.  Clonus is not present.   Bilateral upper and lower extremity sensation is intact to light touch.     Gait is normal.      Medical Decision Making  Imaging: none   Assessment and Plan: He has 2 year history of  intermittent numbness/tingling in left hand x 2 years. More in the palm of hand, not the fingers. Getting more frequent and occurs every other day. No pain, but it can be uncomfortable. No weakness. He's had occasional numbness in right hand.   Symptoms suspicious for carpal tunnel. No tenderness at [facility] joint or first dorsal compartment.   Treatment options discussed with patient and following plan made:   - EMG/NCS of bilateral upper extremities. Orders to Dr. Maree at [facility] neurology.  - Can set up phone visit to review EMG results once I have them back. Depending on results, may consider referral to hand specialist.   I spent a total of 20 minutes in face-to-face and non-face-to-face activities related to this patient's care today including review of outside records, review of imaging, review of symptoms, physical exam, discussion of differential diagnosis, discussion of treatment options, and documentation.   Glade Boys PA-C Dept. of Neurosurgery

## 2024-05-07 ENCOUNTER — Ambulatory Visit (LOCAL_COMMUNITY_HEALTH_CENTER)

## 2024-05-07 DIAGNOSIS — Z719 Counseling, unspecified: Secondary | ICD-10-CM | POA: Insufficient documentation

## 2024-05-07 NOTE — Progress Notes (Signed)
 In clinic for COVID vaccine. Advised pt new COVID vaccine coming out in about one month. Pt decided to wait for new vaccine. Encouraged to call [facility] in early September to ensure we have new vaccine before scheduling  Kandi KATHEE Glatter, RN

## 2024-05-12 ENCOUNTER — Encounter: Payer: Self-pay | Admitting: Orthopedic Surgery

## 2024-05-12 ENCOUNTER — Ambulatory Visit: Admitting: Orthopedic Surgery

## 2024-05-12 ENCOUNTER — Telehealth: Payer: Self-pay | Admitting: Orthopedic Surgery

## 2024-05-12 VITALS — BP 104/66 | Ht 67.0 in | Wt 156.0 lb

## 2024-05-12 DIAGNOSIS — R200 Anesthesia of skin: Secondary | ICD-10-CM

## 2024-05-12 DIAGNOSIS — R202 Paresthesia of skin: Secondary | ICD-10-CM | POA: Diagnosis not present

## 2024-05-12 NOTE — Patient Instructions (Signed)
 It was so nice to see you today. Thank you so much for coming in.    I want to get an EMG (nerve conduction test) to look into the numbness in your left hand further. I have ordered this and [facility] neurology will call you to schedule. You can also call them at [phone].  Once I get the EMG results back, we can set up a phone visit to review them.   Please do not hesitate to call if you have any questions or concerns. You can also message me in MyChart.   Glade Boys PA-C [phone]     The physicians and staff at [facility] Neuro[facility] at [facility] are committed to providing excellent care. You may receive a survey asking for feedback about your experience at our office. We value you your feedback and appreciate you taking the time to to fill it out. The [facility] leadership team is also available to discuss your experience in person, feel free to contact us  [phone].

## 2024-05-12 NOTE — Telephone Encounter (Signed)
Referral faxed to [facility]. 

## 2024-05-12 NOTE — Telephone Encounter (Signed)
 Put in order for EMG/NCS of bilateral upper extremities to evaluate left hand numbness for Dr. Maree at [facility].

## 2024-07-08 ENCOUNTER — Ambulatory Visit (LOCAL_COMMUNITY_HEALTH_CENTER)

## 2024-07-08 DIAGNOSIS — Z23 Encounter for immunization: Secondary | ICD-10-CM

## 2024-07-08 DIAGNOSIS — Z719 Counseling, unspecified: Secondary | ICD-10-CM

## 2024-07-08 NOTE — Progress Notes (Signed)
 Pt presents for High dose Flu vaccine. Patient counseled and given VIS Patient tolerated well.  Copy of NCIR given  Kandi KATHEE Glatter, RN

## 2024-07-14 ENCOUNTER — Ambulatory Visit

## 2024-07-28 ENCOUNTER — Ambulatory Visit (LOCAL_COMMUNITY_HEALTH_CENTER)

## 2024-07-28 DIAGNOSIS — Z719 Counseling, unspecified: Secondary | ICD-10-CM

## 2024-07-28 DIAGNOSIS — Z23 Encounter for immunization: Secondary | ICD-10-CM

## 2024-07-28 NOTE — Progress Notes (Signed)
 In nurse clinic for Moderna 2025-26 Spikevax vaccine. Vaccine given and tolerated well. Updated NCIR copy given and reviewed. Shedric Fredericks, RN

## 2024-08-21 ENCOUNTER — Emergency Department
Admission: EM | Admit: 2024-08-21 | Discharge: 2024-08-21 | Disposition: A | Discharge status: Home / Self Care | Attending: Emergency Medicine | Admitting: Emergency Medicine

## 2024-08-21 ENCOUNTER — Other Ambulatory Visit: Payer: Self-pay

## 2024-08-21 DIAGNOSIS — H11421 Conjunctival edema, right eye: Secondary | ICD-10-CM | POA: Diagnosis not present

## 2024-08-21 DIAGNOSIS — I10 Essential (primary) hypertension: Secondary | ICD-10-CM | POA: Diagnosis not present

## 2024-08-21 DIAGNOSIS — H5789 Other specified disorders of eye and adnexa: Secondary | ICD-10-CM | POA: Diagnosis present

## 2024-08-21 MED ORDER — TETRACAINE HCL 0.5 % OP SOLN
2.0000 [drp] | Freq: Once | OPHTHALMIC | Status: AC
  Administered 2024-08-21: 2 [drp] via OPHTHALMIC
  Filled 2024-08-21: qty 4

## 2024-08-21 MED ORDER — KETOROLAC TROMETHAMINE 0.5 % OP SOLN: 1.0000 [drp] | Freq: Four times a day (QID) | OPHTHALMIC | 0 refills | Status: AC

## 2024-08-21 MED ORDER — ERYTHROMYCIN 5 MG/GM OP OINT
TOPICAL_OINTMENT | Freq: Four times a day (QID) | OPHTHALMIC | Status: DC
  Administered 2024-08-21: 1 via OPHTHALMIC
  Filled 2024-08-21: qty 1

## 2024-08-21 MED ORDER — FLUORESCEIN SODIUM 1 MG OP STRP
1.0000 | ORAL_STRIP | Freq: Once | OPHTHALMIC | Status: AC
  Administered 2024-08-21: 1 via OPHTHALMIC
  Filled 2024-08-21: qty 1

## 2024-08-21 NOTE — ED Provider Notes (Signed)
 [facility] Emergency Department Provider Note     Event Date/Time   First MD Initiated Contact with Patient 08/21/24 2023     (approximate)   History   Chemical Exposure (Bleach 2 right eye)   HPI  Lucas Matthews is a 78 y.o. male with a history of hypertension, presents to the ED accompanied by his wife, for evaluation after he had some bleach splashed into his eye.  Patient was at home about to wash dishes, when he inadvertently dropped the bottle of bleach on the counter, and sustained a couple drops splashed into his right eye.  Patient immediately rinsed out his eye with tap water for approximately 10 to 12 minutes.  He presents to the ED in no acute distress, denies any vision change, nausea, vomiting, or dizziness.  He reports to the ED for evaluation after an Internet search, advised that he be evaluated by medical professional.  Patient denies any airway, lines or skin irritation.   Physical Exam   Triage Vital Signs: ED Triage Vitals  Encounter Vitals Group     BP 08/21/24 2019 (!) 140/50     Girls Systolic BP Percentile --      Girls Diastolic BP Percentile --      Boys Systolic BP Percentile --      Boys Diastolic BP Percentile --      Pulse Rate 08/21/24 2019 (!) 54     Resp 08/21/24 2019 16     Temp 08/21/24 2019 98.6 F (37 C)     Temp Source 08/21/24 2019 Oral     SpO2 08/21/24 2019 98 %     Weight --      Height 08/21/24 2020 5' 7 (1.702 m)     Head Circumference --      Peak Flow --      Pain Score 08/21/24 2020 0     Pain Loc --      Pain Education --      Exclude from Growth Chart --     Most recent vital signs: Vitals:   08/21/24 2019  BP: (!) 140/50  Pulse: (!) 54  Resp: 16  Temp: 98.6 F (37 C)  SpO2: 98%    General Awake, no distress. NAD HEENT NCAT. PERRL. EOMI. right eye mildly injected.  No blepharitis appreciated.  No gross foreign body or changes to the anterior chamber.  No fluorescein dye  uptake on exam.  pH noted at 7.0 on exam of the right eye after instillation of 1 drop of tetracaine no rhinorrhea. Mucous membranes are moist.  CV:  Good peripheral perfusion.  RESP:  Normal effort.    ED Results / Procedures / Treatments   Labs (all labs ordered are listed, but only abnormal results are displayed) Labs Reviewed - No data to display   EKG   RADIOLOGY  No results found.   PROCEDURES:  Critical Care performed: No  Procedures   MEDICATIONS ORDERED IN ED: Medications  erythromycin ophthalmic ointment (1 Application Right Eye Given 08/21/24 2113)  tetracaine (PONTOCAINE) 0.5 % ophthalmic solution 2 drop (2 drops Right Eye Given by Other 08/21/24 2058)  fluorescein ophthalmic strip 1 strip (1 strip Both Eyes Given by Other 08/21/24 2058)     IMPRESSION / MDM / ASSESSMENT AND PLAN / ED COURSE  I reviewed the triage vital signs and the nursing notes.  Differential diagnosis includes, but is not limited to, chemosis, conjunctivitis, left corneal ulcer  Patient's presentation is most consistent with acute complicated illness / injury requiring diagnostic workup.  Patient's diagnosis is consistent with chemical conjunctivitis.  Patient presents in no acute distress for evaluation of right eye chemical exposure.  Exam is reassuring.  Patient without any reports of visual change, purulent drainage, crusting, or matting.  pH is neutral on exam.  No evidence of any corneal abrasion or ulceration.  Patient will be discharged home with prescriptions for Acular and is given a tube of erythromycin ointment. Patient is to follow up with [facility] or his ophthalmologist as discussed, as needed or otherwise directed. Patient is given ED precautions to return to the ED for any worsening or new symptoms.   FINAL CLINICAL IMPRESSION(S) / ED DIAGNOSES   Final diagnoses:  Chemosis of right conjunctiva     Rx / DC Orders   ED  Discharge Orders          Ordered    ketorolac (ACULAR) 0.5 % ophthalmic solution  4 times daily        08/21/24 2110             Note:  This document was prepared using Dragon voice recognition software and may include unintentional dictation errors.    Loyd Candida LULLA Aldona, PA-C 08/21/24 2242    Jacolyn Pae, MD 08/22/24 0004

## 2024-08-21 NOTE — ED Notes (Addendum)
 This RN called poison control.   Recommendations: None. As he rinsed it himself. Rinsing is all they recommend.

## 2024-08-21 NOTE — ED Triage Notes (Addendum)
 Pt to ed from home via POV for bleach splashed in his right eye about 730 pm tonight. He immediately rinsed it out and has no visual changes. Pt is caox4, in no acute distress and ambulatory to triage. He said he got on the internet and it told him to come here.

## 2024-08-21 NOTE — Discharge Instructions (Signed)
 Use the prescription eyedrops as needed for any discomfort.  Continue to use the antibiotic ointment to help reduce irritation.  Follow-up with your eye care professional or this ED for any worsening symptoms.

## 2024-09-12 ENCOUNTER — Emergency Department

## 2024-09-12 ENCOUNTER — Other Ambulatory Visit: Payer: Self-pay

## 2024-09-12 ENCOUNTER — Emergency Department
Admission: EM | Admit: 2024-09-12 | Discharge: 2024-09-13 | Disposition: A | Attending: Emergency Medicine | Admitting: Emergency Medicine

## 2024-09-12 DIAGNOSIS — E869 Volume depletion, unspecified: Secondary | ICD-10-CM | POA: Insufficient documentation

## 2024-09-12 DIAGNOSIS — R052 Subacute cough: Secondary | ICD-10-CM | POA: Insufficient documentation

## 2024-09-12 DIAGNOSIS — R059 Cough, unspecified: Secondary | ICD-10-CM | POA: Diagnosis present

## 2024-09-12 DIAGNOSIS — R519 Headache, unspecified: Secondary | ICD-10-CM | POA: Insufficient documentation

## 2024-09-12 LAB — BASIC METABOLIC PANEL WITH GFR
Anion gap: 10 (ref 5–15)
BUN: 18 mg/dL (ref 8–23)
CO2: 32 mmol/L (ref 22–32)
Calcium: 9.7 mg/dL (ref 8.9–10.3)
Chloride: 102 mmol/L (ref 98–111)
Creatinine, Ser: 1.48 mg/dL — ABNORMAL HIGH (ref 0.61–1.24)
GFR, Estimated: 48 mL/min — ABNORMAL LOW
Glucose, Bld: 94 mg/dL (ref 70–99)
Potassium: 3.7 mmol/L (ref 3.5–5.1)
Sodium: 144 mmol/L (ref 135–145)

## 2024-09-12 NOTE — ED Triage Notes (Signed)
 Pt presents for dry cough x1 month. Denies fevers, chills, nausea, vomiting. Recently started on benzonatate  without relief. No other medication changes. Endorsing headache of 8/10 from coughing. Has been taking prescribed medications for headache without relief. No OTC medications taken

## 2024-09-13 DIAGNOSIS — R052 Subacute cough: Secondary | ICD-10-CM | POA: Diagnosis not present

## 2024-09-13 LAB — RESP PANEL BY RT-PCR (RSV, FLU A&B, COVID)  RVPGX2
Influenza A by PCR: NEGATIVE
Influenza B by PCR: NEGATIVE
Resp Syncytial Virus by PCR: NEGATIVE
SARS Coronavirus 2 by RT PCR: NEGATIVE

## 2024-09-13 MED ORDER — HYDROCOD POLI-CHLORPHE POLI ER 10-8 MG/5ML PO SUER
5.0000 mL | Freq: Once | ORAL | Status: AC
  Administered 2024-09-13: 5 mL via ORAL
  Filled 2024-09-13: qty 5

## 2024-09-13 MED ORDER — HYDROCOD POLI-CHLORPHE POLI ER 10-8 MG/5ML PO SUER: 5.0000 mL | Freq: Two times a day (BID) | ORAL | 0 refills | Status: AC | PRN

## 2024-09-13 NOTE — Discharge Instructions (Addendum)
 It appears you need to drink more fluids and stay hydrated, but other than that, your evaluation tonight was reassuring.  We prescribed you some cough syrup that may help your symptoms, but remember this can lead to altered mental status or drowsiness.  Please only use it if your symptoms are significant to help you sleep.  Follow-up with your lung specialist at the next available opportunity or with your primary care provider.  Return to the emergency department if you develop new or worsening symptoms that concern you.

## 2024-09-13 NOTE — ED Provider Notes (Signed)
 "  [facility] Provider Note    Event Date/Time   First MD Initiated Contact with Patient 09/12/24 2352     (approximate)   History   Cough   HPI Lucas Matthews is a 78 y.o. male who was treated by a pulmonologist for asbestos related lung disease and who uses inhalers and nebulizer treatments as needed.  For the last month or so he has had a persistent cough.  He saw his pulmonologist between 1 and 2 weeks ago who prescribed Tessalon  Perles, but he said it is not helping.  He coughs so much that it gives him a headache.  He has trouble sleeping and it is worse at night.  No fever, chest pain, shortness of breath (except when coughing), nausea, vomiting, nor abdominal pain.     Physical Exam   Triage Vital Signs: ED Triage Vitals  Encounter Vitals Group     BP 09/12/24 2312 130/73     Girls Systolic BP Percentile --      Girls Diastolic BP Percentile --      Boys Systolic BP Percentile --      Boys Diastolic BP Percentile --      Pulse Rate 09/12/24 2312 65     Resp 09/12/24 2312 16     Temp 09/12/24 2312 98.1 F (36.7 C)     Temp Source 09/12/24 2312 Oral     SpO2 09/12/24 2312 97 %     Weight 09/12/24 2309 71.2 kg (157 lb)     Height 09/12/24 2309 1.702 m (5' 7)     Head Circumference --      Peak Flow --      Pain Score 09/12/24 2307 0     Pain Loc --      Pain Education --      Exclude from Growth Chart --     Most recent vital signs: Vitals:   09/12/24 2312  BP: 130/73  Pulse: 65  Resp: 16  Temp: 98.1 F (36.7 C)  SpO2: 97%    General: Awake, no distress.  Appears younger than chronological age.  Pleasant and conversant, laughing and joking with me. CV:  Good peripheral perfusion.  Regular rate and rhythm. Resp:  Normal effort. Speaking easily and comfortably, no accessory muscle usage nor intercostal retractions.  Lungs are clear to auscultation bilaterally.  When patient takes a deep breath, he has a dry  cough. Abd:  No distention.    ED Results / Procedures / Treatments   Labs (all labs ordered are listed, but only abnormal results are displayed) Labs Reviewed  BASIC METABOLIC PANEL WITH GFR - Abnormal; Notable for the following components:      Result Value   Creatinine, Ser 1.48 (*)    GFR, Estimated 48 (*)    All other components within normal limits  RESP PANEL BY RT-PCR (RSV, FLU A&B, COVID)  [id number]     RADIOLOGY I independently viewed and interpret the patient's chest x-rays and there is no evidence of pneumonia or pulmonary edema.  I also read the radiologist's report, which confirmed no acute findings.   PROCEDURES:  Critical Care performed: No  Procedures    IMPRESSION / MDM / ASSESSMENT AND PLAN / ED COURSE  I reviewed the triage vital signs and the nursing notes.  Differential diagnosis includes, but is not limited to, viral illness, pneumonia, mesothelioma or other chronic lung disease, aspirated foreign body.  Patient's presentation is most consistent with acute presentation with potential threat to life or bodily function.  Labs/studies ordered: Respiratory viral panel, BMP, two-view chest x-ray  Interventions/Medications given:  Medications  chlorpheniramine-HYDROcodone (TUSSIONEX) 10-8 MG/5ML suspension 5 mL (has no administration in time range)    (Note:  hospital course my include additional interventions and/or labs/studies not listed above.)   Patient has stable vitals, no hypoxia, and relatively mild symptoms observed in the emergency department.  His creatinine is slightly elevated and he was advised to drink more fluids, but this also does not seem to be related to his current issue and he is able to tolerate oral intake.  I talked with him about the risks and benefits of taking something like Tussionex and he and his son, who is at bedside, are comfortable with giving it a try.  I encouraged him to follow-up with  his pulmonologist and they agree with the plan.  I gave my usual and customary return precautions.  The patient's medical screening exam is reassuring with no indication of an emergent medical condition requiring hospitalization or additional evaluation at this point.  The patient is safe and appropriate for discharge and outpatient follow up.         FINAL CLINICAL IMPRESSION(S) / ED DIAGNOSES   Final diagnoses:  Subacute cough  Volume depletion     Rx / DC Orders   ED Discharge Orders          Ordered    chlorpheniramine-HYDROcodone (TUSSIONEX) 10-8 MG/5ML  Every 12 hours PRN        09/13/24 0105             Note:  This document was prepared using Dragon voice recognition software and may include unintentional dictation errors.   Gordan Huxley, MD 09/13/24 [phone]  "

## 2024-09-26 ENCOUNTER — Emergency Department
Admission: EM | Admit: 2024-09-26 | Discharge: 2024-09-26 | Disposition: A | Discharge status: Home / Self Care | Attending: Emergency Medicine | Admitting: Emergency Medicine

## 2024-09-26 ENCOUNTER — Other Ambulatory Visit: Payer: Self-pay

## 2024-09-26 ENCOUNTER — Emergency Department

## 2024-09-26 DIAGNOSIS — K5900 Constipation, unspecified: Secondary | ICD-10-CM | POA: Insufficient documentation

## 2024-09-26 DIAGNOSIS — R300 Dysuria: Secondary | ICD-10-CM | POA: Insufficient documentation

## 2024-09-26 LAB — COMPREHENSIVE METABOLIC PANEL WITH GFR
ALT: 16 U/L (ref 0–44)
AST: 27 U/L (ref 15–41)
Albumin: 4.1 g/dL (ref 3.5–5.0)
Alkaline Phosphatase: 50 U/L (ref 38–126)
Anion gap: 10 (ref 5–15)
BUN: 7 mg/dL — ABNORMAL LOW (ref 8–23)
CO2: 25 mmol/L (ref 22–32)
Calcium: 9.6 mg/dL (ref 8.9–10.3)
Chloride: 103 mmol/L (ref 98–111)
Creatinine, Ser: 1.01 mg/dL (ref 0.61–1.24)
GFR, Estimated: >60 mL/min
Glucose, Bld: 115 mg/dL — ABNORMAL HIGH (ref 70–99)
Potassium: 3.9 mmol/L (ref 3.5–5.1)
Sodium: 138 mmol/L (ref 135–145)
Total Bilirubin: 1 mg/dL (ref 0.0–1.2)
Total Protein: 6.9 g/dL (ref 6.5–8.1)

## 2024-09-26 LAB — URINALYSIS, ROUTINE W REFLEX MICROSCOPIC
Bacteria, UA: NONE SEEN
Bilirubin Urine: NEGATIVE
Glucose, UA: NEGATIVE mg/dL
Ketones, ur: NEGATIVE mg/dL
Leukocytes,Ua: NEGATIVE
Nitrite: NEGATIVE
Protein, ur: NEGATIVE mg/dL
Specific Gravity, Urine: 1.002 — ABNORMAL LOW (ref 1.005–1.030)
Squamous Epithelial / HPF: 0 /HPF (ref 0–5)
pH: 6 (ref 5.0–8.0)

## 2024-09-26 LAB — CBC
HCT: 42.8 % (ref 39.0–52.0)
Hemoglobin: 14.3 g/dL (ref 13.0–17.0)
MCH: 30 pg (ref 26.0–34.0)
MCHC: 33.4 g/dL (ref 30.0–36.0)
MCV: 89.9 fL (ref 80.0–100.0)
Platelets: 205 K/uL (ref 150–400)
RBC: 4.76 MIL/uL (ref 4.22–5.81)
RDW: 12.6 % (ref 11.5–15.5)
WBC: 7.7 K/uL (ref 4.0–10.5)
nRBC: 0 % (ref 0.0–0.2)

## 2024-09-26 LAB — LIPASE, BLOOD: Lipase: 24 U/L (ref 11–51)

## 2024-09-26 NOTE — Discharge Instructions (Addendum)
 Use the OTC MiraLAX up to 3 times daily to help Modha normal stool.  Use OTC glycerin suppositories to help with any fecal impaction.  Increase your fiber intake as discussed.

## 2024-09-26 NOTE — ED Triage Notes (Signed)
 Pt to ED via POV from home. Pt reports constipation and dysuria x4 days. Pt reports on medication for enlarged prostate. Pt reports lower abd pain. Denies N/V or fevers.

## 2024-09-26 NOTE — ED Provider Notes (Signed)
 "   [facility] Emergency Department Provider Note     Event Date/Time   First MD Initiated Contact with Patient 09/26/24 1041     (approximate)   History   Constipation and Dysuria   HPI  Lucas Matthews is a 79 y.o. male with a history of hypertension, BPH, and lumbar radiculopathy, who presents to the ED endorsing constipation and dysuria.  Patient would endorse 4 days of painful urination as well as some low abdominal pain.  Denies any frank nausea, vomiting, or fevers.  No bright red blood per rectum, melanotic stools, or fecal incontinence reported.  Physical Exam   Triage Vital Signs: ED Triage Vitals [09/26/24 0906]  Encounter Vitals Group     BP 127/74     Girls Systolic BP Percentile      Girls Diastolic BP Percentile      Boys Systolic BP Percentile      Boys Diastolic BP Percentile      Pulse Rate 70     Resp 18     Temp 98.1 F (36.7 C)     Temp Source Oral     SpO2 98 %     Weight 156 lb 8.4 oz (71 kg)     Height 5' 7 (1.702 m)     Head Circumference      Peak Flow      Pain Score 0     Pain Loc      Pain Education      Exclude from Growth Chart     Most recent vital signs: Vitals:   09/26/24 0906  BP: 127/74  Pulse: 70  Resp: 18  Temp: 98.1 F (36.7 C)  SpO2: 98%    General Awake, no distress. NAD HEENT NCAT. PERRL. EOMI. No rhinorrhea. Mucous membranes are moist.  CV:  Good peripheral perfusion. RRR RESP:  Normal effort.  ABD:  No distention. Soft, nontender. No rebound, guarding, or organomegaly. Normal bowel sounds. DRE deferred.    ED Results / Procedures / Treatments   Labs (all labs ordered are listed, but only abnormal results are displayed) Labs Reviewed  COMPREHENSIVE METABOLIC PANEL WITH GFR - Abnormal; Notable for the following components:      Result Value   Glucose, Bld 115 (*)    BUN 7 (*)    All other components within normal limits  URINALYSIS, ROUTINE W REFLEX MICROSCOPIC -  Abnormal; Notable for the following components:   Color, Urine STRAW (*)    APPearance CLEAR (*)    Specific Gravity, Urine 1.002 (*)    Hgb urine dipstick SMALL (*)    All other components within normal limits  LIPASE, BLOOD  CBC    EKG   RADIOLOGY  I personally viewed and evaluated these images as part of my medical decision making, as well as reviewing the written report by the radiologist.  ED Provider Interpretation: Mild colonic stool burden no evidence of SBO or fecal impaction  DG Abdomen 1 View Result Date: 09/26/2024 EXAM: 1 VIEW XRAY OF THE ABDOMEN 09/26/2024 09:26:07 AM COMPARISON: None available. CLINICAL HISTORY: constipation FINDINGS: BOWEL: Mild colonic stool burden diffusely throughout the colon. Nonobstructive bowel gas pattern. SOFT TISSUES: No abnormal calcifications. BONES: Mild dextroscoliosis of the lumbar spine. Mild degenerative change of the lower lumbar spine. Mild degenerative change of the hips. No acute fracture. IMPRESSION: 1. Mild colonic stool burden diffusely throughout the colon. Electronically signed by: Norman Gatlin MD 09/26/2024 09:28 AM EST RP Workstation:  [id number]    PROCEDURES:  Critical Care performed: No  Procedures   MEDICATIONS ORDERED IN ED: Medications - No data to display   IMPRESSION / MDM / ASSESSMENT AND PLAN / ED COURSE  I reviewed the triage vital signs and the nursing notes.                              Differential diagnosis includes, but is not limited to, acute appendicitis, renal colic, testicular torsion, urinary tract infection/pyelonephritis, prostatitis,  epididymitis, diverticulitis, small bowel obstruction or ileus, colitis, abdominal aortic aneurysm, gastroenteritis, hernia, etc.   Patient's presentation is most consistent with acute complicated illness / injury requiring diagnostic workup.  Patient's diagnosis is consistent with constipation and BPH.  Patient with no acute lab abnormalities. Abdominal XR  shows moderate stool, without evidence of bowel obstruction. We discussed treatment options including glycerin suppositories and mineral enema. The patient has declined any interventions bedside. Patient will be discharged home with instructions to manage symptoms with OTC Mralax and increased fiber intake. Patient is to follow up with his PCP as needed or otherwise directed. Patient is given ED precautions to return to the ED for any worsening or new symptoms.   FINAL CLINICAL IMPRESSION(S) / ED DIAGNOSES   Final diagnoses:  Constipation, unspecified constipation type     Rx / DC Orders   ED Discharge Orders     None        Note:  This document was prepared using Dragon voice recognition software and may include unintentional dictation errors.    Loyd Candida LULLA Aldona, PA-C 09/26/24 1729  "

## 2024-09-26 NOTE — ED Notes (Signed)
 Patient declined discharge vital signs.
